# Patient Record
Sex: Male | Born: 1960 | Race: White | Hispanic: No | Marital: Single | Smoking: Former smoker
Health system: Southern US, Community
[De-identification: ages and names within clinical notes are randomized; demographics above are authoritative.]

## PROBLEM LIST (undated history)

## (undated) DIAGNOSIS — I10 Essential (primary) hypertension: Secondary | ICD-10-CM

## (undated) DIAGNOSIS — J9601 Acute respiratory failure with hypoxia: Secondary | ICD-10-CM

## (undated) DIAGNOSIS — I509 Heart failure, unspecified: Secondary | ICD-10-CM

## (undated) DIAGNOSIS — I272 Pulmonary hypertension, unspecified: Secondary | ICD-10-CM

## (undated) DIAGNOSIS — J449 Chronic obstructive pulmonary disease, unspecified: Secondary | ICD-10-CM

## (undated) DIAGNOSIS — J189 Pneumonia, unspecified organism: Secondary | ICD-10-CM

## (undated) DIAGNOSIS — R0902 Hypoxemia: Secondary | ICD-10-CM

## (undated) HISTORY — PX: ABDOMINAL SURGERY: SHX537

## (undated) HISTORY — DX: Chronic obstructive pulmonary disease, unspecified: J44.9

## (undated) HISTORY — DX: Essential (primary) hypertension: I10

---

## 2016-09-10 DIAGNOSIS — I2609 Other pulmonary embolism with acute cor pulmonale: Secondary | ICD-10-CM | POA: Insufficient documentation

## 2016-09-10 DIAGNOSIS — R7989 Other specified abnormal findings of blood chemistry: Secondary | ICD-10-CM | POA: Insufficient documentation

## 2016-09-10 DIAGNOSIS — F1010 Alcohol abuse, uncomplicated: Secondary | ICD-10-CM | POA: Insufficient documentation

## 2016-09-10 DIAGNOSIS — I21A1 Myocardial infarction type 2: Secondary | ICD-10-CM | POA: Insufficient documentation

## 2016-09-10 DIAGNOSIS — Z720 Tobacco use: Secondary | ICD-10-CM | POA: Insufficient documentation

## 2021-12-30 ENCOUNTER — Inpatient Hospital Stay: Admission: AD | Admit: 2021-12-30 | Payer: Self-pay | Admitting: Internal Medicine

## 2022-01-01 ENCOUNTER — Other Ambulatory Visit: Payer: Self-pay

## 2022-01-01 ENCOUNTER — Emergency Department (HOSPITAL_COMMUNITY): Payer: BLUE CROSS/BLUE SHIELD

## 2022-01-01 ENCOUNTER — Encounter (HOSPITAL_COMMUNITY): Payer: Self-pay | Admitting: *Deleted

## 2022-01-01 ENCOUNTER — Inpatient Hospital Stay (HOSPITAL_COMMUNITY)
Admission: EM | Admit: 2022-01-01 | Discharge: 2022-01-03 | DRG: 193 | Disposition: A | Discharge status: Home / Self Care | Payer: BLUE CROSS/BLUE SHIELD | Attending: Internal Medicine | Admitting: Internal Medicine

## 2022-01-01 DIAGNOSIS — I2729 Other secondary pulmonary hypertension: Secondary | ICD-10-CM | POA: Diagnosis present

## 2022-01-01 DIAGNOSIS — I110 Hypertensive heart disease with heart failure: Secondary | ICD-10-CM | POA: Diagnosis present

## 2022-01-01 DIAGNOSIS — I509 Heart failure, unspecified: Secondary | ICD-10-CM

## 2022-01-01 DIAGNOSIS — R911 Solitary pulmonary nodule: Secondary | ICD-10-CM | POA: Diagnosis present

## 2022-01-01 DIAGNOSIS — I10 Essential (primary) hypertension: Secondary | ICD-10-CM | POA: Diagnosis present

## 2022-01-01 DIAGNOSIS — Z8249 Family history of ischemic heart disease and other diseases of the circulatory system: Secondary | ICD-10-CM | POA: Diagnosis not present

## 2022-01-01 DIAGNOSIS — J9601 Acute respiratory failure with hypoxia: Secondary | ICD-10-CM | POA: Diagnosis present

## 2022-01-01 DIAGNOSIS — J181 Lobar pneumonia, unspecified organism: Secondary | ICD-10-CM | POA: Diagnosis present

## 2022-01-01 DIAGNOSIS — Z79899 Other long term (current) drug therapy: Secondary | ICD-10-CM

## 2022-01-01 DIAGNOSIS — Z87891 Personal history of nicotine dependence: Secondary | ICD-10-CM

## 2022-01-01 DIAGNOSIS — J189 Pneumonia, unspecified organism: Secondary | ICD-10-CM

## 2022-01-01 DIAGNOSIS — I5032 Chronic diastolic (congestive) heart failure: Secondary | ICD-10-CM

## 2022-01-01 DIAGNOSIS — J440 Chronic obstructive pulmonary disease with acute lower respiratory infection: Secondary | ICD-10-CM | POA: Diagnosis present

## 2022-01-01 DIAGNOSIS — Z20822 Contact with and (suspected) exposure to covid-19: Secondary | ICD-10-CM | POA: Diagnosis present

## 2022-01-01 DIAGNOSIS — I272 Pulmonary hypertension, unspecified: Secondary | ICD-10-CM | POA: Diagnosis present

## 2022-01-01 DIAGNOSIS — J9602 Acute respiratory failure with hypercapnia: Secondary | ICD-10-CM | POA: Diagnosis present

## 2022-01-01 DIAGNOSIS — I2781 Cor pulmonale (chronic): Secondary | ICD-10-CM | POA: Diagnosis present

## 2022-01-01 DIAGNOSIS — J9621 Acute and chronic respiratory failure with hypoxia: Principal | ICD-10-CM

## 2022-01-01 DIAGNOSIS — J441 Chronic obstructive pulmonary disease with (acute) exacerbation: Secondary | ICD-10-CM

## 2022-01-01 HISTORY — DX: Hypoxemia: R09.02

## 2022-01-01 HISTORY — DX: Heart failure, unspecified: I50.9

## 2022-01-01 HISTORY — DX: Pneumonia, unspecified organism: J18.9

## 2022-01-01 HISTORY — DX: Acute respiratory failure with hypoxia: J96.01

## 2022-01-01 HISTORY — DX: Pulmonary hypertension, unspecified: I27.20

## 2022-01-01 LAB — COMPREHENSIVE METABOLIC PANEL
ALT: 57 U/L — ABNORMAL HIGH (ref 0–44)
AST: 25 U/L (ref 15–41)
Albumin: 3.7 g/dL (ref 3.5–5.0)
Alkaline Phosphatase: 42 U/L (ref 38–126)
Anion gap: 10 (ref 5–15)
BUN: 20 mg/dL (ref 6–20)
CO2: 40 mmol/L — ABNORMAL HIGH (ref 22–32)
Calcium: 9.5 mg/dL (ref 8.9–10.3)
Chloride: 89 mmol/L — ABNORMAL LOW (ref 98–111)
Creatinine, Ser: 0.91 mg/dL (ref 0.61–1.24)
GFR, Estimated: >60 mL/min (ref >60)
Glucose, Bld: 115 mg/dL — ABNORMAL HIGH (ref 70–99)
Potassium: 3.9 mmol/L (ref 3.5–5.1)
Sodium: 139 mmol/L (ref 135–145)
Total Bilirubin: 0.5 mg/dL (ref 0.3–1.2)
Total Protein: 7.5 g/dL (ref 6.5–8.1)

## 2022-01-01 LAB — CBC WITH DIFFERENTIAL/PLATELET
Abs Immature Granulocytes: 0.82 10*3/uL — ABNORMAL HIGH (ref 0.00–0.07)
Basophils Absolute: 0.2 10*3/uL — ABNORMAL HIGH (ref 0.0–0.1)
Basophils Relative: 2 %
Eosinophils Absolute: 0 10*3/uL (ref 0.0–0.5)
Eosinophils Relative: 0 %
HCT: 48.2 % (ref 39.0–52.0)
Hemoglobin: 15.1 g/dL (ref 13.0–17.0)
Immature Granulocytes: 7 %
Lymphocytes Relative: 18 %
Lymphs Abs: 2.1 10*3/uL (ref 0.7–4.0)
MCH: 29.3 pg (ref 26.0–34.0)
MCHC: 31.3 g/dL (ref 30.0–36.0)
MCV: 93.4 fL (ref 80.0–100.0)
Monocytes Absolute: 0.9 10*3/uL (ref 0.1–1.0)
Monocytes Relative: 8 %
Neutro Abs: 7.9 10*3/uL — ABNORMAL HIGH (ref 1.7–7.7)
Neutrophils Relative %: 65 %
Platelets: 303 10*3/uL (ref 150–400)
RBC: 5.16 MIL/uL (ref 4.22–5.81)
RDW: 12.5 % (ref 11.5–15.5)
WBC: 12 10*3/uL — ABNORMAL HIGH (ref 4.0–10.5)
nRBC: 0 % (ref 0.0–0.2)

## 2022-01-01 LAB — BRAIN NATRIURETIC PEPTIDE: B Natriuretic Peptide: 56 pg/mL (ref 0.0–100.0)

## 2022-01-01 LAB — TROPONIN I (HIGH SENSITIVITY)
Troponin I (High Sensitivity): 6 ng/L (ref <18)
Troponin I (High Sensitivity): 6 ng/L (ref <18)

## 2022-01-01 LAB — RESP PANEL BY RT-PCR (FLU A&B, COVID) ARPGX2
Influenza A by PCR: NEGATIVE
Influenza B by PCR: NEGATIVE
SARS Coronavirus 2 by RT PCR: NEGATIVE

## 2022-01-01 MED ORDER — ACETAMINOPHEN 325 MG PO TABS: 650.0000 mg | ORAL_TABLET | Freq: Four times a day (QID) | ORAL | Status: DC | PRN

## 2022-01-01 MED ORDER — SODIUM CHLORIDE 0.9 % IV SOLN
2.0000 g | Freq: Three times a day (TID) | INTRAVENOUS | Status: DC
  Administered 2022-01-01 – 2022-01-03 (×6): 2 g via INTRAVENOUS
  Filled 2022-01-01 (×6): qty 12.5

## 2022-01-01 MED ORDER — VANCOMYCIN HCL 1500 MG/300ML IV SOLN
1500.0000 mg | INTRAVENOUS | Status: DC
  Administered 2022-01-01: 1500 mg via INTRAVENOUS
  Filled 2022-01-01: qty 300

## 2022-01-01 MED ORDER — ALBUTEROL SULFATE (2.5 MG/3ML) 0.083% IN NEBU: 2.5000 mg | INHALATION_SOLUTION | Freq: Four times a day (QID) | RESPIRATORY_TRACT | Status: DC | PRN

## 2022-01-01 MED ORDER — ACETAMINOPHEN 650 MG RE SUPP: 650.0000 mg | Freq: Four times a day (QID) | RECTAL | Status: DC | PRN

## 2022-01-01 MED ORDER — POLYETHYLENE GLYCOL 3350 17 G PO PACK: 17.0000 g | PACK | Freq: Every day | ORAL | Status: DC | PRN

## 2022-01-01 MED ORDER — ONDANSETRON HCL 4 MG PO TABS
4.0000 mg | ORAL_TABLET | Freq: Four times a day (QID) | ORAL | Status: DC | PRN
Start: 2022-01-01 — End: 2022-01-03

## 2022-01-01 MED ORDER — IOHEXOL 350 MG/ML SOLN
75.0000 mL | Freq: Once | INTRAVENOUS | Status: AC | PRN
  Administered 2022-01-01: 75 mL via INTRAVENOUS

## 2022-01-01 MED ORDER — SILDENAFIL CITRATE 20 MG PO TABS
20.0000 mg | ORAL_TABLET | Freq: Three times a day (TID) | ORAL | Status: DC
  Administered 2022-01-01 – 2022-01-03 (×4): 20 mg via ORAL
  Filled 2022-01-01 (×4): qty 1

## 2022-01-01 MED ORDER — SODIUM CHLORIDE 0.9 % IV SOLN
500.0000 mg | INTRAVENOUS | Status: DC
  Administered 2022-01-01: 500 mg via INTRAVENOUS
  Filled 2022-01-01: qty 5

## 2022-01-01 MED ORDER — SODIUM CHLORIDE 0.9 % IV SOLN
1.0000 g | Freq: Once | INTRAVENOUS | Status: AC
  Administered 2022-01-01: 1 g via INTRAVENOUS
  Filled 2022-01-01: qty 10

## 2022-01-01 MED ORDER — ENOXAPARIN SODIUM 40 MG/0.4ML IJ SOSY
40.0000 mg | PREFILLED_SYRINGE | INTRAMUSCULAR | Status: DC
  Administered 2022-01-01 – 2022-01-02 (×2): 40 mg via SUBCUTANEOUS
  Filled 2022-01-01 (×2): qty 0.4

## 2022-01-01 MED ORDER — ONDANSETRON HCL 4 MG/2ML IJ SOLN: 4.0000 mg | Freq: Four times a day (QID) | INTRAMUSCULAR | Status: DC | PRN

## 2022-01-01 NOTE — ED Triage Notes (Signed)
Pt states he was at [facility] for 4 days and does not feel like he received the treatment he needed to get better; pt O2 sats 85% while in triage; pt was recently diagnosed with pneumonia ?

## 2022-01-01 NOTE — Assessment & Plan Note (Addendum)
Presenting with difficulty breathing.  Minimal cough.  WBC 12.  Afebrile.  Rules out for sepsis. ?- CTA chest negative for PE, shows mild patchy dependent airspace consolidation bilateral lungs left greater than right. ?-Recent ED visit, boarded in ED for 4 days, chest x-ray with findings of moderate pulmonary edema or multifocal pneumonia--has had 5-6 days of prior abx ?-Urine strep and Legionella ?-PCT <0.10 ?-pt has 3 more days levofloxacin at home from prior hospitalization--I instructed pt to finish that up ?

## 2022-01-01 NOTE — Assessment & Plan Note (Addendum)
Stable. ?-Holding lisinopril temporarily for now with contrast exposure. ?

## 2022-01-01 NOTE — Assessment & Plan Note (Addendum)
Incidental finding today on CT a chest 01/01/22- solitary 5 mm noncalcified right upper lobe pulmonary nodule.  ?-Follow-up as outpatient ?

## 2022-01-01 NOTE — Assessment & Plan Note (Addendum)
O2 sats down to 85% on room air with tachypnea  ?-currently on 2 L sats greater than 95%.   ?Due to COPD exacerbation, and pneumonia. ?CTA chest--no PE--patchy lower lobe consolidations, left greater than right.  Right upper lobe nodule.  ?-Obtain echo EF 55-60%, no WMA, normal PASP ?-Patient requesting pulmonary consult>>appreciated>>can follow up outpt ?D/c home with 3L [place] ?

## 2022-01-01 NOTE — Assessment & Plan Note (Addendum)
Unspecified type.  Recent diagnosis at [facility] ER.  proBNP was checked and elevated at 760.  Chest x-ray showed pulmonary edema.  Was diuresed with IV Lasix 40 mg.  Echo was not obtained. ?Last echo per Care Everywhere 2019, EF 60 to 65%. ?-Will obtain updated echo  ?- At this time he appears euvolemic we will hold off on further Lasix for now ? ?

## 2022-01-01 NOTE — Assessment & Plan Note (Addendum)
Stable. ?-Resume sildenafil ?01/02/22 echocardiogram EF 55-60%, no WMA, normal PASP ?

## 2022-01-01 NOTE — ED Provider Notes (Signed)
? ?Emergency Department Provider Note ? ? ?I have reviewed the triage vital signs and the nursing notes. ? ? ?HISTORY ? ?Chief Complaint ?Shortness of Breath ? ? ?HPI ?Gerald Rios is a 61 y.o. male with PMH of pulmonary HTN presents to the ED for evaluation of worsening shortness of breath and fatigue.  Patient was evaluated at nearby emergency department on 4/16.  He was boarding in the emergency department awaiting transfer to a higher level of care when he ultimately decided that he was feeling better and went home.  He continues his home medications and was sent home with Lasix as well as Levaquin for presumed multifocal pneumonia.  He states since returning home his symptoms of gradually worsened.  Denies chest pain, fever, productive cough.  He states he did not sleep well at all last night and is feeling fatigued.  ? ? ?Past Medical History:  ?Diagnosis Date  ? Acute respiratory failure with hypoxia and hypercarbia (HCC)   ? Heart failure ([place])   ? Oxygen deficiency   ? Pneumonia   ? Pulmonary hypertension ([place])   ? ? ?Review of Systems ? ?Constitutional: No fever/chills. Positive fatigue.  ?Eyes: No visual changes. ?ENT: No sore throat. ?Cardiovascular: Denies chest pain. ?Respiratory: Positive shortness of breath. ?Gastrointestinal: No abdominal pain.  No nausea, no vomiting.  No diarrhea.  No constipation. ?Genitourinary: Negative for dysuria. ?Musculoskeletal: Negative for back pain. ?Skin: Negative for rash. ?Neurological: Negative for headaches. ? ?____________________________________________ ? ? ?PHYSICAL EXAM: ? ?VITAL SIGNS: ?ED Triage Vitals  ?Enc Vitals Group  ?   BP 01/01/22 1129 (!) 159/101  ?   Pulse Rate 01/01/22 1129 89  ?   Resp 01/01/22 1129 20  ?   Temp 01/01/22 1129 98.1 ?F (36.7 ?C)  ?   Temp Source 01/01/22 1129 Oral  ?   SpO2 01/01/22 1129 (!) 86 %  ?   Weight 01/01/22 1130 165 lb (74.8 kg)  ?   Height 01/01/22 1130 '5\' 5"'$  (1.651 m)  ? ?Constitutional: Alert and oriented. Well  appearing and in no acute distress. ?Eyes: Conjunctivae are normal.  ?Head: Atraumatic. ?Nose: No congestion/rhinnorhea. ?Mouth/Throat: Mucous membranes are moist.  ?Neck: No stridor.   ?Cardiovascular: Normal rate, regular rhythm. Good peripheral circulation. Grossly normal heart sounds.   ?Respiratory: Normal respiratory effort.  No retractions. Lungs CTAB. ?Gastrointestinal: Soft and nontender. No distention.  ?Musculoskeletal: No lower extremity tenderness nor edema. No gross deformities of extremities. ?Neurologic:  Normal speech and language. No gross focal neurologic deficits are appreciated.  ?Skin:  Skin is warm, dry and intact. No rash noted. ? ?____________________________________________ ?  ?LABS ?(all labs ordered are listed, but only abnormal results are displayed) ? ?Labs Reviewed  ?COMPREHENSIVE METABOLIC PANEL - Abnormal; Notable for the following components:  ?    Result Value  ? Chloride 89 (*)   ? CO2 40 (*)   ? Glucose, Bld 115 (*)   ? ALT 57 (*)   ? All other components within normal limits  ?CBC WITH DIFFERENTIAL/PLATELET - Abnormal; Notable for the following components:  ? WBC 12.0 (*)   ? Neutro Abs 7.9 (*)   ? Basophils Absolute 0.2 (*)   ? Abs Immature Granulocytes 0.82 (*)   ? All other components within normal limits  ?RESP PANEL BY RT-PCR (FLU A&B, COVID) [id number]  ?BRAIN NATRIURETIC PEPTIDE  ?TROPONIN I (HIGH SENSITIVITY)  ?TROPONIN I (HIGH SENSITIVITY)  ? ?____________________________________________ ? ?EKG ? ? EKG Interpretation ? ?Date/Time:  Thursday January 01 2022 12:36:10 EDT ?Ventricular Rate:  79 ?PR Interval:  143 ?QRS Duration: 140 ?QT Interval:  442 ?QTC Calculation: 504 ?R Axis:   89 ?Text Interpretation: Sinus rhythm Right bundle branch block Baseline wander in lead(s) V6 No prior tracing for comparison Confirmed by Nanda Quinton [id number]) on 01/01/2022 12:40:15 PM ?  ? ?  ? ? ?____________________________________________ ? ?RADIOLOGY ? ?CT Angio Chest PE W and/or Wo  Contrast ? ?Result Date: 01/01/2022 ?CLINICAL DATA:  Shortness of breath. Pulmonary embolism (PE) suspected, high prob EXAM: CT ANGIOGRAPHY CHEST WITH CONTRAST TECHNIQUE: Multidetector CT imaging of the chest was performed using the standard protocol during bolus administration of intravenous contrast. Multiplanar CT image reconstructions and MIPs were obtained to evaluate the vascular anatomy. RADIATION DOSE REDUCTION: This exam was performed according to the departmental dose-optimization program which includes automated exposure control, adjustment of the mA and/or kV according to patient size and/or use of iterative reconstruction technique. CONTRAST:  40m OMNIPAQUE IOHEXOL 350 MG/ML SOLN COMPARISON:  Same day chest x-ray FINDINGS: Cardiovascular: Satisfactory opacification of the pulmonary arteries to the segmental level. No evidence of pulmonary embolism. Thoracic aorta is nonaneurysmal. Minimal atherosclerotic calcification of the aorta and coronary arteries. Normal heart size. No pericardial effusion. Mediastinum/Nodes: No enlarged mediastinal, hilar, or axillary lymph nodes. Thyroid gland, trachea, and esophagus demonstrate no significant findings. Lungs/Pleura: Mild patchy dependent airspace consolidations within the posterior basal segments of the bilateral lower lobes, left greater than right. 5 mm noncalcified pulmonary nodule at the peripheral aspect of the right upper lobe (series 6, image 79). Minimal centrilobular emphysema with apical predominance. No pleural effusion or pneumothorax. Upper Abdomen: No acute abnormality. Musculoskeletal: No chest wall abnormality. No acute or significant osseous findings. Review of the MIP images confirms the above findings. IMPRESSION: 1. No evidence of pulmonary embolism. 2. Mild patchy dependent airspace consolidations within the bilateral lower lobes, left greater than right, suggestive of atelectasis and/or pneumonia. 3. Solitary 5 mm noncalcified right upper  lobe pulmonary nodule. No follow-up needed if patient is low-risk.This recommendation follows the consensus statement: Guidelines for Management of Incidental Pulmonary Nodules Detected on CT Images: From the Fleischner Society 2017; Radiology 2017; 284:228-243. 4. Aortic and coronary artery atherosclerosis. 5. Minimal centrilobular emphysema (ICD10-J43.9). Electronically Signed   By: NDavina PokeD.O.   On: 01/01/2022 13:55  ? ?DG Chest Portable 1 View ? ?Result Date: 01/01/2022 ?CLINICAL DATA:  Shortness of breath. EXAM: PORTABLE CHEST 1 VIEW COMPARISON:  12/31/2021 FINDINGS: The heart size and mediastinal contours are within normal limits. Mild scarring noted in lateral left lung base. The lungs are otherwise clear. The visualized skeletal structures are unremarkable. IMPRESSION: No active disease. Electronically Signed   By: JMarlaine HindM.D.   On: 01/01/2022 12:16   ? ?____________________________________________ ? ? ?PROCEDURES ? ?Procedure(s) performed:  ? ?.Critical Care ?Performed by: LMargette Fast MD ?Authorized by: LMargette Fast MD  ? ?Critical care provider statement:  ?  Critical care time (minutes):  35 ?  Critical care time was exclusive of:  Separately billable procedures and treating other patients and teaching time ?  Critical care was necessary to treat or prevent imminent or life-threatening deterioration of the following conditions:  Respiratory failure ?  Critical care was time spent personally by me on the following activities:  Development of treatment plan with patient or surrogate, discussions with consultants, evaluation of patient's response to treatment, examination of patient, ordering and review of laboratory studies, ordering and review of radiographic studies, ordering  and performing treatments and interventions, pulse oximetry, re-evaluation of patient's condition, review of old charts, obtaining history from patient or surrogate and blood draw for specimens ?  I assumed  direction of critical care for this patient from another provider in my specialty: no   ?  Care discussed with: admitting provider   ? ? ?____________________________________________ ? ? ?INITIAL IMPRESSION / ASSESSMENT AND

## 2022-01-01 NOTE — H&P (Signed)
?History and Physical  ? ? ?Gerald Rios TZG:[id number] DOB: 1961-07-19 DOA: 01/01/2022 ? ?PCP: Tilda Burrow, NP  ? ?Patient coming from: Home ? ?I have personally briefly reviewed patient's old medical records in [place] ? ?Chief Complaint: Difficulty breathing ? ?HPI: Gerald Rios is a 61 y.o. male with medical history significant for pulmonary hypertension, hypertension. ?Patient presented to the ED with complaints of difficulty breathing and fatigue.   ?Patient was at [facility] ER 4 days ago- 4/16, diagnosed with pneumonia, and the plan was to admit him to another facility he boarded in the ED until yesterday he decided to leave (per Care Everywhere transfer was initiated as it was thought patient needed specialized pulmonary care for his severe pulmonary hypertension, not available at that facility) ? He was treated with IV ceftriaxone and azithromycin and was discharged on Levaquin.  Was also diagnosed with acute CHF,  was given IV Lasix, and discharged on 20 mg of Lasix. ?Since getting home his breathing has worsened.  He denies significant cough.  He has pulmonary hypertension for which he takes sildenafil, he has not had any breathing problems related to his pulmonary hypertension prior to this. ?He denies chest pain.  Reports some mild lower extremity swelling prior to [facility] ER visit, but this has resolved. ? ?ED Course: Patient 20.1.  Heart rate 70s to 80s.  Respiratory rate 15-24.  Blood pressure systolic 759F to 638G.  O2 sats down to 85% on room air.  On 2 L sats greater than 96%. ?Chest x-ray without acute abnormality, CTA chest shows mild patchy dependent airspace consolidations within bilateral lower lobes left greater than right suggestive of atelectasis or pneumonia. ?IV ceftriaxone and azithromycin started. ? ?Review of Systems: As per HPI all other systems reviewed and negative. ? ?Past Medical History:  ?Diagnosis Date  ? Acute respiratory failure with hypoxia and hypercarbia  (HCC)   ? Heart failure ([place])   ? Oxygen deficiency   ? Pneumonia   ? Pulmonary hypertension ([place])   ? ? ?Past Surgical History:  ?Procedure Laterality Date  ? ABDOMINAL SURGERY    ? ? ? reports that he has quit smoking. His smoking use included cigarettes. He has quit using smokeless tobacco. He reports current alcohol use. He reports that he does not currently use drugs. ? ?No Known Allergies ? ?Family history of hypertension. ? ?Prior to Admission medications   ?Medication Sig Start Date End Date Taking? Authorizing Provider  ?lisinopril (ZESTRIL) 20 MG tablet Take 20 mg by mouth daily. 12/05/21  Yes [provider]  ?sildenafil (REVATIO) 20 MG tablet Take 20 mg by mouth 3 (three) times daily. 11/18/21  Yes [provider]  ? ? ?Physical Exam: ?Vitals:  ? 01/01/22 1430 01/01/22 1510 01/01/22 1515 01/01/22 1530  ?BP: (!) 158/102   (!) 163/101  ?Pulse: 76 75 72 72  ?Resp: (!) 25 (!) 22 (!) 24 17  ?Temp:      ?TempSrc:      ?SpO2: 97% 97% 95% 99%  ?Weight:      ?Height:      ? ? ?Constitutional: NAD, calm, comfortable ?Vitals:  ? 01/01/22 1430 01/01/22 1510 01/01/22 1515 01/01/22 1530  ?BP: (!) 158/102   (!) 163/101  ?Pulse: 76 75 72 72  ?Resp: (!) 25 (!) 22 (!) 24 17  ?Temp:      ?TempSrc:      ?SpO2: 97% 97% 95% 99%  ?Weight:      ?Height:      ? ?  Eyes: PERRL, lids and conjunctivae normal ?ENMT: Mucous membranes are moist. Posterior pharynx clear of any exudate or lesions.Normal dentition.  ?Neck: normal, supple, no masses, no thyromegaly ?Respiratory: Mild increased work of breathing, but clear to auscultation bilaterally, no wheezing, no crackles. No accessory muscle use.  ?Cardiovascular: Regular rate and rhythm, no murmurs / rubs / gallops. No extremity edema.  Lower extremities warm ?Abdomen: no tenderness, no masses palpated. No hepatosplenomegaly. Bowel sounds positive.  ?Musculoskeletal: no clubbing / cyanosis. No joint deformity upper and lower extremities.  ?Skin: no rashes, lesions,  ulcers. No induration ?Neurologic: No apparent cranial nerve abnormality, moving extremities spontaneously ?Psychiatric: Normal judgment and insight. Alert and oriented x 3. Normal mood.  ? ?Labs on Admission: I have personally reviewed following labs and imaging studies ? ?CBC: ?Recent Labs  ?Lab 01/01/22 ?1144  ?WBC 12.0*  ?NEUTROABS 7.9*  ?HGB 15.1  ?HCT 48.2  ?MCV 93.4  ?PLT 303  ? ?Basic Metabolic Panel: ?Recent Labs  ?Lab 01/01/22 ?1144  ?NA 139  ?K 3.9  ?CL 89*  ?CO2 40*  ?GLUCOSE 115*  ?BUN 20  ?CREATININE 0.91  ?CALCIUM 9.5  ? ?Liver Function Tests: ?Recent Labs  ?Lab 01/01/22 ?1144  ?AST 25  ?ALT 57*  ?ALKPHOS 42  ?BILITOT 0.5  ?PROT 7.5  ?ALBUMIN 3.7  ? ? ?Radiological Exams on Admission: ?CT Angio Chest PE W and/or Wo Contrast ? ?Result Date: 01/01/2022 ?CLINICAL DATA:  Shortness of breath. Pulmonary embolism (PE) suspected, high prob EXAM: CT ANGIOGRAPHY CHEST WITH CONTRAST TECHNIQUE: Multidetector CT imaging of the chest was performed using the standard protocol during bolus administration of intravenous contrast. Multiplanar CT image reconstructions and MIPs were obtained to evaluate the vascular anatomy. RADIATION DOSE REDUCTION: This exam was performed according to the departmental dose-optimization program which includes automated exposure control, adjustment of the mA and/or kV according to patient size and/or use of iterative reconstruction technique. CONTRAST:  13m OMNIPAQUE IOHEXOL 350 MG/ML SOLN COMPARISON:  Same day chest x-ray FINDINGS: Cardiovascular: Satisfactory opacification of the pulmonary arteries to the segmental level. No evidence of pulmonary embolism. Thoracic aorta is nonaneurysmal. Minimal atherosclerotic calcification of the aorta and coronary arteries. Normal heart size. No pericardial effusion. Mediastinum/Nodes: No enlarged mediastinal, hilar, or axillary lymph nodes. Thyroid gland, trachea, and esophagus demonstrate no significant findings. Lungs/Pleura: Mild patchy  dependent airspace consolidations within the posterior basal segments of the bilateral lower lobes, left greater than right. 5 mm noncalcified pulmonary nodule at the peripheral aspect of the right upper lobe (series 6, image 79). Minimal centrilobular emphysema with apical predominance. No pleural effusion or pneumothorax. Upper Abdomen: No acute abnormality. Musculoskeletal: No chest wall abnormality. No acute or significant osseous findings. Review of the MIP images confirms the above findings. IMPRESSION: 1. No evidence of pulmonary embolism. 2. Mild patchy dependent airspace consolidations within the bilateral lower lobes, left greater than right, suggestive of atelectasis and/or pneumonia. 3. Solitary 5 mm noncalcified right upper lobe pulmonary nodule. No follow-up needed if patient is low-risk.This recommendation follows the consensus statement: Guidelines for Management of Incidental Pulmonary Nodules Detected on CT Images: From the Fleischner Society 2017; Radiology 2017; 284:228-243. 4. Aortic and coronary artery atherosclerosis. 5. Minimal centrilobular emphysema (ICD10-J43.9). Electronically Signed   By: NDavina PokeD.O.   On: 01/01/2022 13:55  ? ?DG Chest Portable 1 View ? ?Result Date: 01/01/2022 ?CLINICAL DATA:  Shortness of breath. EXAM: PORTABLE CHEST 1 VIEW COMPARISON:  12/31/2021 FINDINGS: The heart size and mediastinal contours are within normal limits. Mild  scarring noted in lateral left lung base. The lungs are otherwise clear. The visualized skeletal structures are unremarkable. IMPRESSION: No active disease. Electronically Signed   By: Marlaine Hind M.D.   On: 01/01/2022 12:16   ? ?EKG: Independently reviewed.  Sinus rhythm rate 82.  QTc 495.  RBBB, LPFB.  No prior EKG to compare. ? ?Assessment/Plan ?Principal Problem: ?  Acute respiratory failure with hypoxia ([place]) ?Active Problems: ?  Pneumonia ?  Pulmonary HTN ([place]) ?  Essential hypertension ?  Pulmonary nodule ?  Congestive heart  failure (CHF) ([place]) ? ? ?Assessment and Plan: ?* Acute respiratory failure with hypoxia (HCC) ?O2 sats down to 85% on room air, currently on 2 L sats greater than 95%.  CT suggest pneumonia.  He has baseline pulmonary h

## 2022-01-01 NOTE — Progress Notes (Signed)
Pharmacy Antibiotic Note ? ?Gerald Rios is a 61 y.o. male admitted on 01/01/2022 with pneumonia.  Pharmacy has been consulted for Vancomycin and Cefepime dosing. ? ?Patient was at [facility] ER 4/16-4/19 where he received IV rocephin and azithromycin, left 4/19 with Rx for Levaquin. Now at [facility] ED with difficulty breathing. ? ?Plan: ?Cefepime 2gm IV q8h ?Vancomycin '1500mg'$  IV Q 24 hrs. Goal AUC 400-550. Expected AUC: 407. SCr used: 0.91 ?Will f/u renal function, micro data, and pt's clinical condition ?Vanc levels prn ? ? ?Height: '5\' 5"'$  (165.1 cm) ?Weight: 74.8 kg (165 lb) ?IBW/kg (Calculated) : 61.5 ? ?Temp (24hrs), Avg:98 ?F (36.7 ?C), Min:97.9 ?F (36.6 ?C), Max:98.1 ?F (36.7 ?C) ? ?Recent Labs  ?Lab 01/01/22 ?1144  ?WBC 12.0*  ?CREATININE 0.91  ?  ?Estimated Creatinine Clearance: 83.6 mL/min (by C-G formula based on SCr of 0.91 mg/dL).   ? ?No Known Allergies ? ?Antimicrobials this admission: ?4/20 Vanc >>  ?4/20 Cefepime >>  ?4/20 Rocephin x 1 ?4/20 Azith x 1 ? ?Microbiology results: ? Pending ? ?Thank you for allowing pharmacy to be a part of this patient?s care. ? ?Sherlon Handing, PharmD, BCPS ?Please see amion for complete clinical pharmacist phone list ?01/01/2022 5:18 PM ? ?

## 2022-01-02 ENCOUNTER — Inpatient Hospital Stay (HOSPITAL_COMMUNITY): Payer: BLUE CROSS/BLUE SHIELD

## 2022-01-02 DIAGNOSIS — I5032 Chronic diastolic (congestive) heart failure: Secondary | ICD-10-CM

## 2022-01-02 DIAGNOSIS — J9601 Acute respiratory failure with hypoxia: Secondary | ICD-10-CM | POA: Diagnosis not present

## 2022-01-02 DIAGNOSIS — J441 Chronic obstructive pulmonary disease with (acute) exacerbation: Secondary | ICD-10-CM | POA: Diagnosis not present

## 2022-01-02 DIAGNOSIS — I272 Pulmonary hypertension, unspecified: Secondary | ICD-10-CM | POA: Diagnosis not present

## 2022-01-02 DIAGNOSIS — J181 Lobar pneumonia, unspecified organism: Principal | ICD-10-CM

## 2022-01-02 DIAGNOSIS — I10 Essential (primary) hypertension: Secondary | ICD-10-CM | POA: Diagnosis not present

## 2022-01-02 LAB — ECHOCARDIOGRAM COMPLETE
AR max vel: 2.34 cm2
AV Area VTI: 2.4 cm2
AV Area mean vel: 2.7 cm2
AV Mean grad: 5 mmHg
AV Peak grad: 11.3 mmHg
Ao pk vel: 1.68 m/s
Area-P 1/2: 4.06 cm2
Calc EF: 55.1 %
Height: 65 in
MV VTI: 3.6 cm2
S' Lateral: 3 cm
Single Plane A2C EF: 48.7 %
Single Plane A4C EF: 61.4 %
Weight: 2670.21 oz

## 2022-01-02 LAB — CBC
HCT: 46.3 % (ref 39.0–52.0)
Hemoglobin: 14.1 g/dL (ref 13.0–17.0)
MCH: 28.8 pg (ref 26.0–34.0)
MCHC: 30.5 g/dL (ref 30.0–36.0)
MCV: 94.7 fL (ref 80.0–100.0)
Platelets: 294 10*3/uL (ref 150–400)
RBC: 4.89 MIL/uL (ref 4.22–5.81)
RDW: 12.5 % (ref 11.5–15.5)
WBC: 11.1 10*3/uL — ABNORMAL HIGH (ref 4.0–10.5)
nRBC: 0 % (ref 0.0–0.2)

## 2022-01-02 LAB — BASIC METABOLIC PANEL
Anion gap: 9 (ref 5–15)
BUN: 19 mg/dL (ref 6–20)
CO2: 40 mmol/L — ABNORMAL HIGH (ref 22–32)
Calcium: 9.1 mg/dL (ref 8.9–10.3)
Chloride: 93 mmol/L — ABNORMAL LOW (ref 98–111)
Creatinine, Ser: 0.93 mg/dL (ref 0.61–1.24)
GFR, Estimated: >60 mL/min (ref >60)
Glucose, Bld: 83 mg/dL (ref 70–99)
Potassium: 4.1 mmol/L (ref 3.5–5.1)
Sodium: 142 mmol/L (ref 135–145)

## 2022-01-02 LAB — HIV ANTIBODY (ROUTINE TESTING W REFLEX): HIV Screen 4th Generation wRfx: NONREACTIVE

## 2022-01-02 LAB — PROCALCITONIN: Procalcitonin: <0.1 ng/mL

## 2022-01-02 LAB — MRSA NEXT GEN BY PCR, NASAL: MRSA by PCR Next Gen: NOT DETECTED

## 2022-01-02 MED ORDER — IPRATROPIUM-ALBUTEROL 0.5-2.5 (3) MG/3ML IN SOLN
3.0000 mL | Freq: Four times a day (QID) | RESPIRATORY_TRACT | Status: DC
  Administered 2022-01-02 (×2): 3 mL via RESPIRATORY_TRACT
  Filled 2022-01-02 (×2): qty 3

## 2022-01-02 MED ORDER — BUDESONIDE 0.5 MG/2ML IN SUSP
0.5000 mg | Freq: Two times a day (BID) | RESPIRATORY_TRACT | Status: DC
Start: 2022-01-02 — End: 2022-01-03
  Administered 2022-01-02 – 2022-01-03 (×2): 0.5 mg via RESPIRATORY_TRACT
  Filled 2022-01-02 (×2): qty 2

## 2022-01-02 MED ORDER — IPRATROPIUM-ALBUTEROL 0.5-2.5 (3) MG/3ML IN SOLN
3.0000 mL | Freq: Three times a day (TID) | RESPIRATORY_TRACT | Status: DC
  Administered 2022-01-03: 3 mL via RESPIRATORY_TRACT
  Filled 2022-01-02: qty 3

## 2022-01-02 MED ORDER — METHYLPREDNISOLONE SODIUM SUCC 40 MG IJ SOLR
40.0000 mg | Freq: Two times a day (BID) | INTRAMUSCULAR | Status: DC
  Administered 2022-01-02 – 2022-01-03 (×2): 40 mg via INTRAVENOUS
  Filled 2022-01-02 (×2): qty 1

## 2022-01-02 MED ORDER — ARFORMOTEROL TARTRATE 15 MCG/2ML IN NEBU
15.0000 ug | INHALATION_SOLUTION | Freq: Two times a day (BID) | RESPIRATORY_TRACT | Status: DC
  Administered 2022-01-02 – 2022-01-03 (×2): 15 ug via RESPIRATORY_TRACT
  Filled 2022-01-02 (×2): qty 2

## 2022-01-02 NOTE — Progress Notes (Signed)
*  PRELIMINARY RESULTS* ?Echocardiogram ?2D Echocardiogram has been performed. ? ?Gerald Rios ?01/02/2022, 11:05 AM ?

## 2022-01-02 NOTE — Assessment & Plan Note (Addendum)
Recent diagnosis at [facility] ER.   ?proBNP was checked at [facility] at 760.  Chest x-ray showed pulmonary edema.  Was diuresed with IV Lasix 40 mg.  ?-- Currently appears euvolemic ?--BNP 56 ?-- Echo above ?

## 2022-01-02 NOTE — TOC Progression Note (Signed)
?  Transition of Care (TOC) Screening Note ? ? ?Patient Details  ?Name: Gerald Rios ?Date of Birth: March 31, 1961 ? ? ?Transition of Care (TOC) CM/SW Contact:    ?Boneta Lucks, RN ?Phone Number: ?01/02/2022, 10:23 AM ? ?DC tomorrow- weaning today, watch for home oxygen. ? ?Transition of Care Department [facility]) has reviewed patient and no TOC needs have been identified at this time. We will continue to monitor patient advancement through interdisciplinary progression rounds. If new patient transition needs arise, please place a TOC consult. ? ? ? ?Expected Discharge Plan: Home/Self Care ?Barriers to Discharge: Continued Medical Work up ? ?Expected Discharge Plan and Services ?Expected Discharge Plan: Home/Self Care ?  ?   ?

## 2022-01-02 NOTE — Consult Note (Signed)
? ?NAME:  Gerald Rios, MRN:  [id number], DOB:  [birth year], LOS: 1 ?ADMISSION DATE:  01/01/2022, CONSULTATION DATE:  01/02/22 ?REFERRING MD:  Tat , CHIEF COMPLAINT:  sob  ? ?History of Present Illness:  ?61 y.o. 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   ?Patient was at [facility] ER 4 days PTA= 4/16, diagnosed with pneumonia, and the plan was to admit him to another facility he boarded in the ED until 4/19 he decided to leave (per Care Everywhere transfer was initiated as it was thought patient needed specialized pulmonary care for his severe pulmonary hypertension, not available at that facility) ? He was treated with IV ceftriaxone and azithromycin and was discharged on Levaquin.  Was also diagnosed with acute CHF,  was given IV Lasix, and discharged on 20 mg of Lasix. ?Since getting home his breathing   worsened.  He denied significant cough.  He has pulmonary hypertension for which he takes sildenafil, he has not had any breathing problems related to his pulmonary hypertension prior to this. ?He denies chest pain.  Reports some mild lower extremity swelling prior to [facility] ER visit, but this has resolved. ?  ?ED Course: Patient 76.1.  Heart rate 70s to 80s.  Respiratory rate 15-24.  Blood pressure systolic 409W to 119J.  O2 sats down to 85% on room air. On 2 L sats greater than 96%. ?Chest x-ray without acute abnormality, CTA chest shows mild patchy dependent airspace consolidations within bilateral lower lobes left greater than right suggestive of atelectasis or pneumonia. ?IV ceftriaxone and azithromycin started. ?  ? ?Baseline:  denies doe slow and flat, some problems with hills and steps. Not using 02 or much saba at all, no leg swelling  ? ?MMRC1 =  says can walk nl pace, flat grade, can't hurry or go uphills or steps s sob  s 02  ?  ? ?Significant Hospital Events: ?Including  procedures, antibiotic start and stop dates in addition to other pertinent events   ? ?RVP  01/01/22 neg  ?MRSA pcr 01/02/22 neg ? ? ?Scheduled Meds: ? arformoterol  15 mcg Nebulization BID  ? budesonide (PULMICORT) nebulizer solution  0.5 mg Nebulization BID  ? enoxaparin (LOVENOX) injection  40 mg Subcutaneous Q24H  ? ipratropium-albuterol  3 mL Nebulization Q6H  ? methylPREDNISolone (SOLU-MEDROL) injection  40 mg Intravenous Q12H  ? sildenafil  20 mg Oral TID  ? ?Continuous Infusions: ? ceFEPime (MAXIPIME) IV 2 g (01/02/22 0934)  ? ?PRN Meds:.acetaminophen **OR** acetaminophen, albuterol, ondansetron **OR** ondansetron (ZOFRAN) IV, polyethylene glycol  ? ? ?Interim History / Subjective:  ?Feeling better on 02, still not much cough at all  ? ?Objective   ?Blood pressure (!) 132/94, pulse 83, temperature 98.2 ?F (36.8 ?C), temperature source Oral, resp. rate 18, height '5\' 5"'$  (1.651 m), weight 75.7 kg, SpO2 96 %. ?   ?FiO2 (%):  [24 %] 24 %  ? ?Intake/Output Summary (Last 24 hours) at 01/02/2022 1538 ?Last data filed at 01/02/2022 1350 ?Gross per 24 hour  ?Intake 1904.87 ml  ?Output 200 ml  ?Net 1704.87 ml  ? ?Filed Weights  ? 01/01/22 1130 01/01/22 1950  ?Weight: 74.8 kg 75.7 kg  ? ? ?Examination: ?Tmax:  98.6 ?General appearance:    middle aged wm nad  ?At Rest 02 sats  96% on 3lpm   ?No jvd ?Oropharynx clear,  mucosa nl ?Neck supple ?Lungs with  minimal insp/exp rhonchi bilaterally ?RRR no s3 or or sign murmur ?Abd obese with nl  excursion  ?Extr warm with no edema or clubbing noted ?Neuro  Sensorium intact,  no apparent motor deficits  ? ? ? ?I personally reviewed images and agree with radiology impression as follows:  ? Chest CTa  01/01/22 ?1. No evidence of pulmonary embolism. ?2. Mild patchy dependent airspace consolidations within the ?bilateral lower lobes, left greater than right, suggestive of ?atelectasis and/or pneumonia. ? ? ? ?  ? ?Assessment & Plan:  ?1)  ? AECOPD  r/o  viral or bacterial CAP  ?- unusual  absence of URI type symptoms prior to onset  ?>>> agree with empirical rx with steroids/ laba/lCS  longterm will probably just do laba/lama  with steroids if more convincing aecopd occurs  ?>>> check alpha one AT phenotype. ? ? 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 ?3) Acute on chronic resp failure both hypoxemic and hypercarbic ?>>> titrate to keep sats > 90%  ? ?4) HBP ?ACE inhibitors are problematic in  pts with airway complaints because  even experienced pulmonologists can't always distinguish ace effects from copd/asthma.  By themselves they don't actually cause a problem, much like oxygen can't by itself start a fire, but they certainly serve as a powerful catalyst or enhancer for any "fire"  or inflammatory process in the upper airway, be it caused by an ET  tube or more commonly reflux (especially in the obese or pts with known GERD or who are on biphoshonates).  ? ? In the era of ARB near equivalency until we have a better handle on the reversibility of the airway problem, it just makes sense to avoid ACEI  entirely in the short run and then decide later, having established a level of airway control using a reasonable limited regimen, whether to add back ace but even then being very careful to observe the pt for worsening airway control and number of meds used/ needed to control symptoms.   ? ? ?Will plan on f/u p discharge at pt request  - nothing else to offer for now  ?Best Practice (right click and "Reselect all SmartList Selections" daily)  ?  ?Per triad  ? ?Labs   ?CBC: ?Recent Labs  ?Lab 01/01/22 ?1144 01/02/22 ?0503  ?WBC 12.0* 11.1*  ?NEUTROABS 7.9*  --   ?HGB 15.1 14.1  ?HCT 48.2 46.3  ?MCV 93.4 94.7  ?PLT 303 294  ? ? ?Basic Metabolic Panel: ?Recent Labs  ?Lab 01/01/22 ?1144 01/02/22 ?0503  ?NA 139 142  ?K 3.9 4.1  ?CL 89* 93*  ?CO2 40* 40*  ?GLUCOSE 115* 83  ?BUN 20 19  ?CREATININE 0.91 0.93  ?CALCIUM 9.5 9.1  ? ?GFR: ?Estimated  Creatinine Clearance: 82.3 mL/min (by C-G formula based on SCr of 0.93 mg/dL). ?Recent Labs  ?Lab 01/01/22 ?1144 01/02/22 ?0503  ?PROCALCITON  --  <0.10  ?WBC 12.0* 11.1*  ? ? ?Liver Function Tests: ?Recent Labs  ?Lab 01/01/22 ?1144  ?AST 25  ?ALT 57*  ?ALKPHOS 42  ?BILITOT 0.5  ?PROT 7.5  ?ALBUMIN 3.7  ? ?No results for input(s): LIPASE, AMYLASE in the last 168 hours. ?No results for input(s): AMMONIA in the last 168 hours. ? ?ABG ?No results found for: PHART, PCO2ART, PO2ART, HCO3, TCO2, ACIDBASEDEF, O2SAT  ? ?Coagulation Profile: ?No results for input(s): INR, PROTIME  in the last 168 hours. ? ?Cardiac Enzymes: ?No results for input(s): CKTOTAL, CKMB, CKMBINDEX, TROPONINI in the last 168 hours. ? ?HbA1C: ?No results found for: HGBA1C ? ?CBG: ?No results for input(s): GLUCAP in the last 168 hours. ? ?  ? ?Past Medical History:  ?He,  has a past medical history of Acute respiratory failure with hypoxia and hypercarbia ([place]), Heart failure ([place]), Oxygen deficiency, Pneumonia, and Pulmonary hypertension ([place]).  ? ?Surgical History:  ? ?Past Surgical History:  ?Procedure Laterality Date  ? ABDOMINAL SURGERY    ?  ? ?Social History:  ? reports that he has quit smoking. His smoking use included cigarettes. He has quit using smokeless tobacco. He reports current alcohol use. He reports that he does not currently use drugs.  ? ?Family History:  ?His family history is not on file.  ? ?Allergies ?No Known Allergies  ? ?Home Medications  ?Prior to Admission medications   ?Medication Sig Start Date End Date Taking? Authorizing Provider  ?lisinopril (ZESTRIL) 20 MG tablet Take 20 mg by mouth daily. 12/05/21  Yes [provider]  ?sildenafil (REVATIO) 20 MG tablet Take 20 mg by mouth 3 (three) times daily. 11/18/21  Yes [provider]  ?  ?  ?Christinia Gully, MD ?Pulmonary and Critical Care Medicine ?[place] ?Cell [phone]  ? ?After 7:00 pm call Elink  [phone]  ? ?  ?

## 2022-01-02 NOTE — Hospital Course (Signed)
61 year old male with history of pulmonary hypertension, systemic hypertension, COPD, remote tobacco use presenting with shortness of breath.  Patient states that he began having shortness of breath on 12/26/2021.  He presented to [facility] emergency department where he was boarded in the ED waiting for a transfer until 12/31/2021 when he decided to leave the hospital.  During his stay in the ED at [facility], the patient was treated for pulmonary edema with IV furosemide, and started on ceftriaxone and azithromycin for pneumonia.  He was also given intravenous Solu-Medrol.  He was discharged home with levofloxacin and Lasix for 5 days.  He stated that his breathing did improve a little bit, but he became short of breath again on 01/01/2022 which allowed him to come to [facility].  He denies any fevers, chills, chest pain, headache, neck pain, hemoptysis, nausea, vomiting, diarrhea, abdominal pain.  He has some dyspnea on exertion.  He stated that at the time he presented to [facility] he did have peripheral edema, but he states that now his peripheral edema has resolved.  He has not had any worsening orthopnea or PND type symptoms. ?In the ED, the patient was afebrile hemodynamically stable with oxygen saturation 86% on room air.  He was placed on 2 L with saturation 94-96%.  WBC 12.0, hemoglobin 15.1, platelets 203,000.  Sodium 139, potassium 3.9, bicarbonate 40, serum creatinine 0.91.  BNP was 56.  Troponin 6>>6. ?

## 2022-01-02 NOTE — Progress Notes (Signed)
?  ?       ?PROGRESS NOTE ? ?Vernie Piet JQB:[id number] DOB: 1961/02/02 DOA: 01/01/2022 ?PCP: Tilda Burrow, NP ? ?Brief History:  ?61 year old male with history of pulmonary hypertension, systemic hypertension, COPD, remote tobacco use presenting with shortness of breath.  Patient states that he began having shortness of breath on 12/26/2021.  He presented to [facility] emergency department where he was boarded in the ED waiting for a transfer until 12/31/2021 when he decided to leave the hospital.  During his stay in the ED at [facility], the patient was treated for pulmonary edema with IV furosemide, and started on ceftriaxone and azithromycin for pneumonia.  He was also given intravenous Solu-Medrol.  He was discharged home with levofloxacin and Lasix for 5 days.  He stated that his breathing did improve a little bit, but he became short of breath again on 01/01/2022 which allowed him to come to [facility].  He denies any fevers, chills, chest pain, headache, neck pain, hemoptysis, nausea, vomiting, diarrhea, abdominal pain.  He has some dyspnea on exertion.  He stated that at the time he presented to [facility] he did have peripheral edema, but he states that now his peripheral edema has resolved.  He has not had any worsening orthopnea or PND type symptoms. ?In the ED, the patient was afebrile hemodynamically stable with oxygen saturation 86% on room air.  He was placed on 2 L with saturation 94-96%.  WBC 12.0, hemoglobin 15.1, platelets 203,000.  Sodium 139, potassium 3.9, bicarbonate 40, serum creatinine 0.91.  BNP was 56.  Troponin 6>>6.  ? ? ? ?Assessment and Plan: ?* Acute respiratory failure with hypoxia (HCC) ?O2 sats down to 85% on room air with tachypnea  ?-currently on 2 L sats greater than 95%.   ?Due to COPD exacerbation, and pneumonia. ?CTA chest--no PE--patchy lower lobe consolidations, left greater than right.  Right upper lobe nodule.  ?-Obtain echo ?-Patient requesting pulmonary consult ? ?Lobar pneumonia  ([place]) ?Presenting with difficulty breathing.  Minimal cough.  WBC 12.  Afebrile.  Rules out for sepsis. ?- CTA chest negative for PE, shows mild patchy dependent airspace consolidation bilateral lungs left greater than right. ?-Recent ED visit, boarded in ED for 4 days, chest x-ray with findings of moderate pulmonary edema or multifocal pneumonia--has had 5-6 days of prior abx ?-Urine strep and Legionella ?-PCT <0.10 ?-likely de-escalate to po abx in next 24 hours ? ?COPD with acute exacerbation ([place]) ?Start Pulmicort ?DuoNebs ? ? ?Chronic heart failure with preserved ejection fraction (HFpEF) ([place]) ?Recent diagnosis at [facility] ER.   ?proBNP was checked at [facility] at 760.  Chest x-ray showed pulmonary edema.  Was diuresed with IV Lasix 40 mg.  ?-- Currently appears euvolemic ?--BNP 56 ?-- Echocardiogram ? ?Pulmonary nodule ?Incidental finding today on CT a chest 01/01/22- solitary 5 mm noncalcified right upper lobe pulmonary nodule.  ?-Follow-up as outpatient ? ?Essential hypertension ?Stable. ?-Holding lisinopril temporarily for now with contrast exposure. ? ?Pulmonary HTN ([place]) ?Stable. ?-Resume sildenafil ?-Obtain echocardiogram ? ? ? ? ? ? ?Family Communication:  no Family at bedside ? ?Consultants:  pulm ? ?Code Status:  FULL ? ?DVT Prophylaxis:  [place] Lovenox ? ? ?Procedures: ?As Listed in Progress Note Above ? ?Antibiotics: ?Cefepime 4/20>> ?Vanc 4/20 ? ? ? ? ? ?Subjective: ? ?Patient has some dyspnea on exertion.  He states that his breathing is slowly improving.  He denies any fevers, chills, chest pain, headache, nausea, vomiting, diarrhea, abdominal pain.  There is no  dysuria or hematuria.  No hematochezia or melena. ?Objective: ?Vitals:  ? 01/01/22 1950 01/01/22 2203 01/02/22 0207 01/02/22 0554  ?BP:  (!) 156/94 (!) 147/82 (!) 143/80  ?Pulse:  69 80 90  ?Resp:  '18 20 19  '$ ?Temp:  97.8 ?F (36.6 ?C) 97.7 ?F (36.5 ?C) 97.9 ?F (36.6 ?C)  ?TempSrc:      ?SpO2:  96% 94% 95%  ?Weight: 75.7 kg     ?Height: '5\' 5"'$  (1.651 m)      ? ? ?Intake/Output Summary (Last 24 hours) at 01/02/2022 0829 ?Last data filed at 01/02/2022 [phone] ?Gross per 24 hour  ?Intake 1096.21 ml  ?Output 200 ml  ?Net 896.21 ml  ? ?Weight change:  ?Exam: ? ?General:  Pt is alert, follows commands appropriately, not in acute distress ?HEENT: No icterus, No thrush, No neck mass, [place]/AT ?Cardiovascular: RRR, S1/S2, no rubs, no gallops ?Respiratory: Diminished breath sounds bilateral.  Bibasilar rales. ?Abdomen: Soft/+BS, non tender, non distended, no guarding ?Extremities: No edema, No lymphangitis, No petechiae, No rashes, no synovitis ? ? ?Data Reviewed: ?I have personally reviewed following labs and imaging studies ?Basic Metabolic Panel: ?Recent Labs  ?Lab 01/01/22 ?1144 01/02/22 ?0503  ?NA 139 142  ?K 3.9 4.1  ?CL 89* 93*  ?CO2 40* 40*  ?GLUCOSE 115* 83  ?BUN 20 19  ?CREATININE 0.91 0.93  ?CALCIUM 9.5 9.1  ? ?Liver Function Tests: ?Recent Labs  ?Lab 01/01/22 ?1144  ?AST 25  ?ALT 57*  ?ALKPHOS 42  ?BILITOT 0.5  ?PROT 7.5  ?ALBUMIN 3.7  ? ?No results for input(s): LIPASE, AMYLASE in the last 168 hours. ?No results for input(s): AMMONIA in the last 168 hours. ?Coagulation Profile: ?No results for input(s): INR, PROTIME in the last 168 hours. ?CBC: ?Recent Labs  ?Lab 01/01/22 ?1144 01/02/22 ?0503  ?WBC 12.0* 11.1*  ?NEUTROABS 7.9*  --   ?HGB 15.1 14.1  ?HCT 48.2 46.3  ?MCV 93.4 94.7  ?PLT 303 294  ? ?Cardiac Enzymes: ?No results for input(s): CKTOTAL, CKMB, CKMBINDEX, TROPONINI in the last 168 hours. ?BNP: ?Invalid input(s): POCBNP ?CBG: ?No results for input(s): GLUCAP in the last 168 hours. ?HbA1C: ?No results for input(s): HGBA1C in the last 72 hours. ?Urine analysis: ?No results found for: COLORURINE, APPEARANCEUR, [place], [place], [place], [place], [place], KETONESUR, PROTEINUR, [place], NITRITE, LEUKOCYTESUR ?Sepsis Labs: ?'@LABRCNTIP'$ (procalcitonin:4,lacticidven:4) ?) ?Recent Results (from the past 240 hour(s))  ?Resp Panel by RT-PCR (Flu A&B, Covid)  Nasopharyngeal Swab     Status: None  ? Collection Time: 01/01/22 11:44 AM  ? Specimen: Nasopharyngeal Swab; Nasopharyngeal(NP) swabs in vial transport medium  ?Result Value Ref Range Status  ? SARS Coronavirus 2 by RT PCR NEGATIVE NEGATIVE Final  ?  Comment: (NOTE) ?SARS-CoV-2 target nucleic acids are NOT DETECTED. ? ?The SARS-CoV-2 RNA is generally detectable in upper respiratory ?specimens during the acute phase of infection. The lowest ?concentration of SARS-CoV-2 viral copies this assay can detect is ?138 copies/mL. A negative result does not preclude SARS-Cov-2 ?infection and should not be used as the sole basis for treatment or ?other patient management decisions. A negative result may occur with  ?improper specimen collection/handling, submission of specimen other ?than nasopharyngeal swab, presence of viral mutation(s) within the ?areas targeted by this assay, and inadequate number of viral ?copies(<138 copies/mL). A negative result must be combined with ?clinical observations, patient history, and epidemiological ?information. The expected result is Negative. ? ?Fact Sheet for Patients:  ?EntrepreneurPulse.com.au ? ?Fact Sheet for Healthcare Providers:  ?IncredibleEmployment.be ? ?This test is  no t yet approved or cleared by the [place] and  ?has been authorized for detection and/or diagnosis of SARS-CoV-2 by ?FDA under an Emergency Use Authorization (EUA). This EUA will remain  ?in effect (meaning this test can be used) for the duration of the ?COVID-19 declaration under Section 564(b)(1) of the Act, 21 ?U.S.C.section 360bbb-3(b)(1), unless the authorization is terminated  ?or revoked sooner.  ? ? ?  ? Influenza A by PCR NEGATIVE NEGATIVE Final  ? Influenza B by PCR NEGATIVE NEGATIVE Final  ?  Comment: (NOTE) ?The Xpert Xpress SARS-CoV-2/FLU/RSV plus assay is intended as an aid ?in the diagnosis of influenza from Nasopharyngeal swab specimens and ?should not be  used as a sole basis for treatment. Nasal washings and ?aspirates are unacceptable for Xpert Xpress SARS-CoV-2/FLU/RSV ?testing. ? ?Fact Sheet for Patients: ?EntrepreneurPulse.com.au ? ?Fact Sheet fo

## 2022-01-02 NOTE — Assessment & Plan Note (Addendum)
Start Pulmicort ?DuoNebs ?Treated with IV solumedrol 40 mg bid during hospitalization with clinical improvement ?D/c home with prednisone 50 mg x 4 more days ?D/c home with LABA and rescue albuterol ? ?

## 2022-01-03 LAB — BASIC METABOLIC PANEL
Anion gap: 5 (ref 5–15)
BUN: 18 mg/dL (ref 6–20)
CO2: 41 mmol/L — ABNORMAL HIGH (ref 22–32)
Calcium: 9.1 mg/dL (ref 8.9–10.3)
Chloride: 94 mmol/L — ABNORMAL LOW (ref 98–111)
Creatinine, Ser: 0.77 mg/dL (ref 0.61–1.24)
GFR, Estimated: >60 mL/min (ref >60)
Glucose, Bld: 105 mg/dL — ABNORMAL HIGH (ref 70–99)
Potassium: 4.7 mmol/L (ref 3.5–5.1)
Sodium: 140 mmol/L (ref 135–145)

## 2022-01-03 LAB — SEDIMENTATION RATE: Sed Rate: 14 mm/hr (ref 0–16)

## 2022-01-03 LAB — MAGNESIUM: Magnesium: 2.4 mg/dL (ref 1.7–2.4)

## 2022-01-03 LAB — TSH: TSH: 2.994 u[IU]/mL (ref 0.350–4.500)

## 2022-01-03 MED ORDER — MOMETASONE FURO-FORMOTEROL FUM 200-5 MCG/ACT IN AERO: 2.0000 | INHALATION_SPRAY | Freq: Two times a day (BID) | RESPIRATORY_TRACT | 1 refills | Status: DC

## 2022-01-03 MED ORDER — LOSARTAN POTASSIUM 50 MG PO TABS: 25.0000 mg | ORAL_TABLET | Freq: Every day | ORAL | Status: DC

## 2022-01-03 MED ORDER — PREDNISONE 20 MG PO TABS: 50.0000 mg | ORAL_TABLET | Freq: Every day | ORAL | Status: DC

## 2022-01-03 MED ORDER — LOSARTAN POTASSIUM 25 MG PO TABS: 25.0000 mg | ORAL_TABLET | Freq: Every day | ORAL | 1 refills | Status: DC

## 2022-01-03 MED ORDER — PREDNISONE 50 MG PO TABS: 50.0000 mg | ORAL_TABLET | Freq: Every day | ORAL | 0 refills | Status: DC

## 2022-01-03 MED ORDER — ALBUTEROL SULFATE HFA 108 (90 BASE) MCG/ACT IN AERS: 2.0000 | INHALATION_SPRAY | RESPIRATORY_TRACT | 1 refills | Status: DC | PRN

## 2022-01-03 MED ORDER — ALBUTEROL SULFATE (2.5 MG/3ML) 0.083% IN NEBU: 3.0000 mL | INHALATION_SOLUTION | RESPIRATORY_TRACT | Status: DC | PRN

## 2022-01-03 MED ORDER — MOMETASONE FURO-FORMOTEROL FUM 200-5 MCG/ACT IN AERO: 2.0000 | INHALATION_SPRAY | Freq: Two times a day (BID) | RESPIRATORY_TRACT | Status: DC

## 2022-01-03 MED ORDER — IPRATROPIUM-ALBUTEROL 0.5-2.5 (3) MG/3ML IN SOLN: 3.0000 mL | Freq: Two times a day (BID) | RESPIRATORY_TRACT | Status: DC

## 2022-01-03 NOTE — Progress Notes (Signed)
Patient discharged home today, transported home by family. Discharge paperwork went over with patient, patient verbalized understanding. Belongings sent home with patient. Patient discharged with home oxygen that [employer] delivered. ?

## 2022-01-03 NOTE — Progress Notes (Signed)
CSW spoke with Jasmine at [employer] to initiate home oxygen. [employer] will deliver oxygen to patient's room prior to discharge. ? ?Madilyn Fireman, MSW, LCSW ?Transitions of Care  Clinical Social Worker II ?[phone] ? ?

## 2022-01-03 NOTE — Progress Notes (Signed)
SATURATION QUALIFICATIONS: (This note is used to comply with regulatory documentation for home oxygen) ?  ?Patient Saturations on Room Air at Rest = 92 ?  ?Patient Saturations on Room Air while Ambulating = 84 ?  ?Patient Saturations on 3 Liters of oxygen while Ambulating = 96 ?  ?Please briefly explain why patient needs home oxygen: To maintain 02 sat at 90% or above during ambulation. ? ?Orson Eva, DO ? ?

## 2022-01-03 NOTE — Discharge Summary (Signed)
?Physician Discharge Summary ?  ?Patient: Gerald Rios MRN: [id number] DOB: [birth year]  ?Admit date:     01/01/2022  ?Discharge date: 01/03/22  ?Discharge Physician: Shanon Brow Cindel Daugherty  ? ?PCP: Tilda Burrow, NP  ? ?Recommendations at discharge:  ? Please follow up with primary care provider within 1-2 weeks ? Please repeat BMP and CBC in one week ? ? ? ? ? ?Hospital Course: ?61 year old male with history of pulmonary hypertension, systemic hypertension, COPD, remote tobacco use presenting with shortness of breath.  Patient states that he began having shortness of breath on 12/26/2021.  He presented to [facility] emergency department where he was boarded in the ED waiting for a transfer until 12/31/2021 when he decided to leave the hospital.  During his stay in the ED at [facility], the patient was treated for pulmonary edema with IV furosemide, and started on ceftriaxone and azithromycin for pneumonia.  He was also given intravenous Solu-Medrol.  He was discharged home with levofloxacin and Lasix for 5 days.  He stated that his breathing did improve a little bit, but he became short of breath again on 01/01/2022 which allowed him to come to [facility].  He denies any fevers, chills, chest pain, headache, neck pain, hemoptysis, nausea, vomiting, diarrhea, abdominal pain.  He has some dyspnea on exertion.  He stated that at the time he presented to [facility] he did have peripheral edema, but he states that now his peripheral edema has resolved.  He has not had any worsening orthopnea or PND type symptoms. ?In the ED, the patient was afebrile hemodynamically stable with oxygen saturation 86% on room air.  He was placed on 2 L with saturation 94-96%.  WBC 12.0, hemoglobin 15.1, platelets 203,000.  Sodium 139, potassium 3.9, bicarbonate 40, serum creatinine 0.91.  BNP was 56.  Troponin 6>>6. ? ?Assessment and Plan: ?* Acute respiratory failure with hypoxia (HCC) ?O2 sats down to 85% on room air with tachypnea  ?-currently on 2 L sats greater than  95%.   ?Due to COPD exacerbation, and pneumonia. ?CTA chest--no PE--patchy lower lobe consolidations, left greater than right.  Right upper lobe nodule.  ?-Obtain echo EF 55-60%, no WMA, normal PASP ?-Patient requesting pulmonary consult>>appreciated>>can follow up outpt ?D/c home with 3L [place] ? ?Lobar pneumonia ([place]) ?Presenting with difficulty breathing.  Minimal cough.  WBC 12.  Afebrile.  Rules out for sepsis. ?- CTA chest negative for PE, shows mild patchy dependent airspace consolidation bilateral lungs left greater than right. ?-Recent ED visit, boarded in ED for 4 days, chest x-ray with findings of moderate pulmonary edema or multifocal pneumonia--has had 5-6 days of prior abx ?-Urine strep and Legionella ?-PCT <0.10 ?-pt has 3 more days levofloxacin at home from prior hospitalization--I instructed pt to finish that up ? ?COPD with acute exacerbation ([place]) ?Start Pulmicort ?DuoNebs ?Treated with IV solumedrol 40 mg bid during hospitalization with clinical improvement ?D/c home with prednisone 50 mg x 4 more days ?D/c home with LABA and rescue albuterol ? ? ?Chronic heart failure with preserved ejection fraction (HFpEF) ([place]) ?Recent diagnosis at [facility] ER.   ?proBNP was checked at [facility] at 760.  Chest x-ray showed pulmonary edema.  Was diuresed with IV Lasix 40 mg.  ?-- Currently appears euvolemic ?--BNP 56 ?-- Echo above ? ?Pulmonary nodule ?Incidental finding today on CT a chest 01/01/22- solitary 5 mm noncalcified right upper lobe pulmonary nodule.  ?-Follow-up as outpatient ? ?Essential hypertension ?Stable. ?-Holding lisinopril temporarily for now with contrast exposure. ? ?Pulmonary  HTN ([place]) ?Stable. ?-Resume sildenafil ?01/02/22 echocardiogram EF 55-60%, no WMA, normal PASP ? ? ? ? ? ? ?Consultants: pulm ?Procedures performed: none  ?Disposition: Home ?Diet recommendation:  ?Cardiac diet ?DISCHARGE MEDICATION: ?Allergies as of 01/03/2022   ?No Known Allergies ?  ? ?  ?Medication List  ?  ? ?STOP taking these  medications   ? ?lisinopril 20 MG tablet ?Commonly known as: ZESTRIL ?  ? ?  ? ?TAKE these medications   ? ?albuterol 108 (90 Base) MCG/ACT inhaler ?Commonly known as: VENTOLIN HFA ?Inhale 2 puffs into the lungs every 4 (four) hours as needed for wheezing or shortness of breath. ?  ?losartan 25 MG tablet ?Commonly known as: COZAAR ?Take 1 tablet (25 mg total) by mouth daily. ?Start taking on: January 04, 2022 ?  ?mometasone-formoterol 200-5 MCG/ACT Aero ?Commonly known as: DULERA ?Inhale 2 puffs into the lungs 2 (two) times daily. ?  ?predniSONE 50 MG tablet ?Commonly known as: DELTASONE ?Take 1 tablet (50 mg total) by mouth daily with breakfast. ?Start taking on: January 04, 2022 ?  ?sildenafil 20 MG tablet ?Commonly known as: REVATIO ?Take 20 mg by mouth 3 (three) times daily. ?  ? ?  ? ?  ?  ? ? ?  ?Durable Medical Equipment  ?(From admission, onward)  ?  ? ? ?  ? ?  Start     Ordered  ? 01/03/22 1042  For home use only DME oxygen  Once       ?Question Answer Comment  ?Length of Need 6 Months   ?Mode or (Route) Nasal cannula   ?Liters per Minute 3   ?Frequency Continuous (stationary and portable oxygen unit needed)   ?Oxygen delivery system Gas   ?  ? 01/03/22 1041  ? ?  ?  ? ?  ? ? Follow-up Information   ? ? Tanda Rockers, MD Follow up in 2 week(s).   ?Specialty: Pulmonary Disease ?Contact information: ?[address] ?[address] ?[place] [address] ?[phone] ? ? ?  ?  ? ?  ?  ? ?  ? ?Discharge Exam: ?Filed Weights  ? 01/01/22 1130 01/01/22 1950  ?Weight: 74.8 kg 75.7 kg  ? ?HEENT:  [place]/AT, No thrush, no icterus ?CV:  RRR, no rub, no S3, no S4 ?Lung:  diminished BS.  Bibasilar rales.  ?Abd:  soft/+BS, NT ?Ext:  No edema, no lymphangitis, no synovitis, no rash ? ? ?Condition at discharge: stable ? ?The results of significant diagnostics from this hospitalization (including imaging, microbiology, ancillary and laboratory) are listed below for reference.  ? ?Imaging Studies: ?CT Angio Chest PE W and/or Wo  Contrast ? ?Result Date: 01/01/2022 ?CLINICAL DATA:  Shortness of breath. Pulmonary embolism (PE) suspected, high prob EXAM: CT ANGIOGRAPHY CHEST WITH CONTRAST TECHNIQUE: Multidetector CT imaging of the chest was performed using the standard protocol during bolus administration of intravenous contrast. Multiplanar CT image reconstructions and MIPs were obtained to evaluate the vascular anatomy. RADIATION DOSE REDUCTION: This exam was performed according to the departmental dose-optimization program which includes automated exposure control, adjustment of the mA and/or kV according to patient size and/or use of iterative reconstruction technique. CONTRAST:  29m OMNIPAQUE IOHEXOL 350 MG/ML SOLN COMPARISON:  Same day chest x-ray FINDINGS: Cardiovascular: Satisfactory opacification of the pulmonary arteries to the segmental level. No evidence of pulmonary embolism. Thoracic aorta is nonaneurysmal. Minimal atherosclerotic calcification of the aorta and coronary arteries. Normal heart size. No pericardial effusion. Mediastinum/Nodes: No enlarged mediastinal, hilar, or  axillary lymph nodes. Thyroid gland, trachea, and esophagus demonstrate no significant findings. Lungs/Pleura: Mild patchy dependent airspace consolidations within the posterior basal segments of the bilateral lower lobes, left greater than right. 5 mm noncalcified pulmonary nodule at the peripheral aspect of the right upper lobe (series 6, image 79). Minimal centrilobular emphysema with apical predominance. No pleural effusion or pneumothorax. Upper Abdomen: No acute abnormality. Musculoskeletal: No chest wall abnormality. No acute or significant osseous findings. Review of the MIP images confirms the above findings. IMPRESSION: 1. No evidence of pulmonary embolism. 2. Mild patchy dependent airspace consolidations within the bilateral lower lobes, left greater than right, suggestive of atelectasis and/or pneumonia. 3. Solitary 5 mm noncalcified right upper  lobe pulmonary nodule. No follow-up needed if patient is low-risk.This recommendation follows the consensus statement: Guidelines for Management of Incidental Pulmonary Nodules Detected on CT Images: From th

## 2022-01-05 LAB — ALPHA-1-ANTITRYPSIN PHENOTYP: A-1 Antitrypsin, Ser: 179 mg/dL (ref 101–187)

## 2022-04-14 ENCOUNTER — Ambulatory Visit: Payer: BLUE CROSS/BLUE SHIELD | Admitting: Internal Medicine

## 2022-04-14 ENCOUNTER — Encounter: Payer: Self-pay | Admitting: Internal Medicine

## 2022-04-14 DIAGNOSIS — J449 Chronic obstructive pulmonary disease, unspecified: Secondary | ICD-10-CM | POA: Diagnosis not present

## 2022-04-14 DIAGNOSIS — J9611 Chronic respiratory failure with hypoxia: Secondary | ICD-10-CM

## 2022-04-14 DIAGNOSIS — J9612 Chronic respiratory failure with hypercapnia: Secondary | ICD-10-CM

## 2022-04-14 DIAGNOSIS — R109 Unspecified abdominal pain: Secondary | ICD-10-CM

## 2022-04-14 DIAGNOSIS — J441 Chronic obstructive pulmonary disease with (acute) exacerbation: Secondary | ICD-10-CM

## 2022-04-14 MED ORDER — ANORO ELLIPTA 62.5-25 MCG/ACT IN AEPB: 1.0000 | INHALATION_SPRAY | Freq: Every day | RESPIRATORY_TRACT | 11 refills | Status: DC

## 2022-04-14 NOTE — Progress Notes (Unsigned)
Gerald Rios, male    DOB: [birth year]    MRN: [id number]   Brief patient profile:  33   yowm quit smoking 2017 self  referred to pulmonary clinic in [facility]  04/14/2022 for copd/ cor pulmonale      History of Present Illness  04/14/2022  Pulmonary/ 1st office eval/ Melvyn Novas / [facility]  Chief Complaint  Patient presents with   Consult    Dx with pulmonary hypertension since 2017. HFU seen by Dr. Melvyn Novas at [facility] 4/20-4/22. Has O2 at home but he uses as needed.   Dyspnea:  MMRC2 = can't walk a nl pace on a flat grade s sob but does fine slow   Cough: none  Sleep: flat bed/ 1 pillows  SABA use: none 02  2lpm hs / 2lpm at home and dips to 88% with exertion       Past Medical History:  Diagnosis Date   Acute respiratory failure with hypoxia and hypercarbia (HCC)    Heart failure (HCC)    Oxygen deficiency    Pneumonia    Pulmonary hypertension (HCC)     Outpatient Medications Prior to Visit  Medication Sig Dispense Refill   losartan (COZAAR) 25 MG tablet Take 1 tablet (25 mg total) by mouth daily. 30 tablet 1   sildenafil (REVATIO) 20 MG tablet Take 20 mg by mouth 3 (three) times daily.     albuterol (VENTOLIN HFA) 108 (90 Base) MCG/ACT inhaler Inhale 2 puffs into the lungs every 4 (four) hours as needed for wheezing or shortness of breath. 6.7 g 1   mometasone-formoterol (DULERA) 200-5 MCG/ACT AERO Inhale 2 puffs into the lungs 2 (two) times daily. 1 each 1   predniSONE (DELTASONE) 50 MG tablet Take 1 tablet (50 mg total) by mouth daily with breakfast. 4 tablet 0   No facility-administered medications prior to visit.     Objective:     BP (!) 144/86 (BP Location: Left Arm, Patient Position: Sitting)   Pulse (!) 102   Temp 98 F (36.7 C) (Temporal)   Ht '5\' 5"'$  (1.651 m)   Wt 168 lb 12.8 oz (76.6 kg)   SpO2 94% Comment: ra  BMI 28.09 kg/m   SpO2: 94 % (ra)  Amb wm nad  HEENT :  Oropharynx  ***  Nasal turbinates ***   NECK :  without JVD/Nodes/TM/ nl carotid  upstrokes bilaterally   LUNGS: no acc muscle use,  Mod barrel  contour chest wall with bilateral  Distant bs s audible wheeze and  without cough on insp or exp maneuvers and mod  Hyperresonant  to  percussion bilaterally     CV:  RRR  no s3 or murmur or increase in P2, and no edema   ABD:  soft and nontender with pos mid insp Hoover's  in the supine position. No bruits or organomegaly appreciated, bowel sounds nl  MS:   Ext warm without deformities or   obvious joint restrictions , calf tenderness, cyanosis or clubbing  SKIN: warm and dry without lesions    NEURO:  alert, approp, nl sensorium with  no motor or cerebellar deficits apparent.          I personally reviewed images and agree with radiology impression as follows:   Chest CTa  01/01/22 1. No evidence of pulmonary embolism. 2. Mild patchy dependent airspace consolidations within the bilateral lower lobes, left greater than right, suggestive of atelectasis and/or pneumonia. 3. Solitary 5 mm noncalcified right upper lobe pulmonary  nodule. No follow-up needed if patient is low-risk.  4. Aortic and coronary artery atherosclerosis. 5. Minimal centrilobular emphysema (ICD10-J43.9).      Assessment   No problem-specific Assessment & Plan notes found for this encounter.     Christinia Gully, MD 04/14/2022

## 2022-04-14 NOTE — Patient Instructions (Addendum)
Anoro each am :  one click and then take two good vigorous drags and hold 5 secs at least, then rinse mouth   Make sure you check your oxygen saturation  AT  your highest level of activity (not after you stop)   to be sure it stays over 90% and adjust  02 flow upward to maintain this level if needed but remember to turn it back to previous settings when you stop (to conserve your supply).   Classic subdiaphragmatic pain pattern suggests ibs:  Stereotypical, migratory with a very limited distribution of pain locations, daytime, not usually exacerbated by exercise  or coughing, worse in sitting position, frequently associated with generalized abd bloating, not as likely to be present supine due to the dome effect of the diaphragm which  is  canceled in that position. Frequently these patients have had multiple negative GI workups and CT scans.  Treatment consists of avoiding foods that cause gas (especially boiled eggs, mexcican food but especially  beans and undercooked vegetables like  spinach and some salads)  and citrucel 1 heaping tsp twice daily with a large glass of water.  Pain should improve w/in 2 weeks and if not then consider further GI work up.      We will order an overnight pulse oximetry and call you the results    Please the hard copy of your pfts and bring it in next time   Please schedule a follow up visit in 3 months but call sooner if needed

## 2022-04-15 ENCOUNTER — Encounter: Payer: Self-pay | Admitting: Internal Medicine

## 2022-04-15 DIAGNOSIS — R109 Unspecified abdominal pain: Secondary | ICD-10-CM | POA: Insufficient documentation

## 2022-04-15 DIAGNOSIS — J449 Chronic obstructive pulmonary disease, unspecified: Secondary | ICD-10-CM | POA: Insufficient documentation

## 2022-04-15 DIAGNOSIS — J9611 Chronic respiratory failure with hypoxia: Secondary | ICD-10-CM | POA: Insufficient documentation

## 2022-04-15 NOTE — Assessment & Plan Note (Signed)
rx as IBS 04/15/2022 >>>   Classic subdiaphragmatic pain pattern suggests ibs:  Stereotypical, migratory with a very limited distribution of pain locations, daytime, not usually exacerbated by exercise  or coughing, worse in sitting position, frequently associated with generalized abd bloating, not as likely to be present supine due to the dome effect of the diaphragm which  is  canceled in that position. Frequently these patients have had multiple negative GI workups and CT scans.  Treatment consists of avoiding foods that cause gas (especially boiled eggs, mexcican food but especially  beans and undercooked vegetables like  spinach and some salads)  and citrucel 1 heaping tsp twice daily with a large glass of water.  Pain should improve w/in 2 weeks and if not then consider further GI work up.

## 2022-04-15 NOTE — Assessment & Plan Note (Signed)
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

## 2022-04-15 NOTE — Assessment & Plan Note (Signed)
HC03  01/03/22   =  41  - 04/14/2022   Walked on RA  x  3  lap(s) =  approx 450  ft  @ mod pace, stopped due to end of study  with lowest 02 sats 89%  And sob on last lap  - ONO on RA 04/14/2022 >>>  Advised goal of sats is lower 90s since he is hypercarbic, does not need it at rest when at baseline  Each maintenance medication was reviewed in detail including emphasizing most importantly the difference between maintenance and prns and under what circumstances the prns are to be triggered using an action plan format where appropriate.  Total time for H and P, chart review, counseling, reviewing dpi/02 device(s) , directly observing portions of ambulatory 02 saturation study/ and generating customized AVS unique to this office visit / same day charting > 40 min for multiple  refractory respiratory  Symptoms  / 1st office visit post hosp

## 2022-04-21 ENCOUNTER — Encounter: Payer: Self-pay | Admitting: Internal Medicine

## 2022-06-08 ENCOUNTER — Other Ambulatory Visit: Payer: Self-pay | Admitting: Family Medicine

## 2022-06-09 ENCOUNTER — Other Ambulatory Visit: Payer: Self-pay

## 2022-06-09 ENCOUNTER — Encounter: Payer: Self-pay | Admitting: *Deleted

## 2022-06-09 ENCOUNTER — Emergency Department (HOSPITAL_COMMUNITY): Payer: BLUE CROSS/BLUE SHIELD

## 2022-06-09 ENCOUNTER — Encounter (HOSPITAL_COMMUNITY): Payer: Self-pay

## 2022-06-09 ENCOUNTER — Emergency Department (HOSPITAL_COMMUNITY)
Admission: EM | Admit: 2022-06-09 | Discharge: 2022-06-09 | Disposition: A | Discharge status: Home / Self Care | Payer: BLUE CROSS/BLUE SHIELD | Attending: Emergency Medicine | Admitting: Emergency Medicine

## 2022-06-09 DIAGNOSIS — Z79899 Other long term (current) drug therapy: Secondary | ICD-10-CM | POA: Diagnosis not present

## 2022-06-09 DIAGNOSIS — I509 Heart failure, unspecified: Secondary | ICD-10-CM | POA: Diagnosis not present

## 2022-06-09 DIAGNOSIS — Z87891 Personal history of nicotine dependence: Secondary | ICD-10-CM | POA: Diagnosis not present

## 2022-06-09 DIAGNOSIS — R1011 Right upper quadrant pain: Secondary | ICD-10-CM | POA: Diagnosis not present

## 2022-06-09 DIAGNOSIS — I110 Hypertensive heart disease with heart failure: Secondary | ICD-10-CM | POA: Diagnosis not present

## 2022-06-09 DIAGNOSIS — J449 Chronic obstructive pulmonary disease, unspecified: Secondary | ICD-10-CM | POA: Diagnosis not present

## 2022-06-09 LAB — COMPREHENSIVE METABOLIC PANEL
ALT: 16 U/L (ref 0–44)
AST: 19 U/L (ref 15–41)
Albumin: 4.2 g/dL (ref 3.5–5.0)
Alkaline Phosphatase: 28 U/L — ABNORMAL LOW (ref 38–126)
Anion gap: 8 (ref 5–15)
BUN: 14 mg/dL (ref 6–20)
CO2: 32 mmol/L (ref 22–32)
Calcium: 9.3 mg/dL (ref 8.9–10.3)
Chloride: 100 mmol/L (ref 98–111)
Creatinine, Ser: 0.83 mg/dL (ref 0.61–1.24)
GFR, Estimated: >60 mL/min (ref >60)
Glucose, Bld: 98 mg/dL (ref 70–99)
Potassium: 4.5 mmol/L (ref 3.5–5.1)
Sodium: 140 mmol/L (ref 135–145)
Total Bilirubin: 1 mg/dL (ref 0.3–1.2)
Total Protein: 6.8 g/dL (ref 6.5–8.1)

## 2022-06-09 LAB — URINALYSIS, ROUTINE W REFLEX MICROSCOPIC
Bilirubin Urine: NEGATIVE
Glucose, UA: NEGATIVE mg/dL
Hgb urine dipstick: NEGATIVE
Ketones, ur: NEGATIVE mg/dL
Leukocytes,Ua: NEGATIVE
Nitrite: NEGATIVE
Protein, ur: NEGATIVE mg/dL
Specific Gravity, Urine: 1.006 (ref 1.005–1.030)
pH: 8 (ref 5.0–8.0)

## 2022-06-09 LAB — CBC
HCT: 40.3 % (ref 39.0–52.0)
Hemoglobin: 13.4 g/dL (ref 13.0–17.0)
MCH: 30.5 pg (ref 26.0–34.0)
MCHC: 33.3 g/dL (ref 30.0–36.0)
MCV: 91.6 fL (ref 80.0–100.0)
Platelets: 199 10*3/uL (ref 150–400)
RBC: 4.4 MIL/uL (ref 4.22–5.81)
RDW: 13 % (ref 11.5–15.5)
WBC: 5.6 10*3/uL (ref 4.0–10.5)
nRBC: 0 % (ref 0.0–0.2)

## 2022-06-09 LAB — LIPASE, BLOOD: Lipase: 32 U/L (ref 11–51)

## 2022-06-09 MED ORDER — IBUPROFEN 600 MG PO TABS: 600.0000 mg | ORAL_TABLET | Freq: Four times a day (QID) | ORAL | 0 refills | Status: DC | PRN

## 2022-06-09 MED ORDER — DICYCLOMINE HCL 20 MG PO TABS: 20.0000 mg | ORAL_TABLET | Freq: Two times a day (BID) | ORAL | 0 refills | Status: DC

## 2022-06-09 MED ORDER — IBUPROFEN 400 MG PO TABS
600.0000 mg | ORAL_TABLET | Freq: Once | ORAL | Status: AC
Start: 2022-06-09 — End: 2022-06-09
  Administered 2022-06-09: 600 mg via ORAL
  Filled 2022-06-09: qty 2

## 2022-06-09 NOTE — Discharge Instructions (Addendum)
Note that your work-up today was overall reassuring.  There were no abnormalities in the lab tests ordered.  The ultrasound of the area where you are tender was negative.  I suspect the etiology of your symptoms being muscular in nature.  You can take ibuprofen/Tylenol as needed for pain.  Close follow-up with your PCP recommended in 2 to 3 days for reevaluation.  Please do not hesitate to return to the emergency department the worrisome signs and symptoms we discussed become apparent.

## 2022-06-09 NOTE — ED Triage Notes (Signed)
Pt presents to ED with complaints of right sided abdominal pain feels dull x couple months. Denies N/V/D

## 2022-06-09 NOTE — ED Provider Notes (Addendum)
[facility] EMERGENCY DEPARTMENT Provider Note   CSN: [id number] Arrival date & time: 06/09/22  2353     History  Chief Complaint  Patient presents with   Abdominal Pain    Gerald Rios is a 61 y.o. male.   Abdominal Pain   61 year old male presents emergency department with complaints of abdominal pain.  Patient states that symptoms have been present over the past 2 to 3 months and have been intermittent in nature.  Denies any associated symptoms with consumption of food.  She states that he notes some pain with movement.  Per patient, he was seen by his PCP for similar symptoms was told to come the emergency department for further evaluation due to lack of insurance coverage of imaging outpatient.  Per prior pulmonologist note, patient has been seen multiple times for same abdominal complaint with negative CT and ultrasounds of the abdomen.  Pulmonologist recommended treatment for IBS.  Patient was question regarding psych treatment and said that he is noted significant improvement with said treatment.  Denies fever, chills, night sweats, chest pain, shortness of breath, nausea, vomiting, urinary symptoms, change in bowel habits.  Patient has brought up symptoms to his pulmonologist who has been treating him with [place] outpatient for concerns of IBS.  Patient states that he has improved significantly with administration of Citrucel but presents emergency department for continued concern.  Past medical history significant for CHF, pulmonary hypertension, COPD  Home Medications Prior to Admission medications   Medication Sig Start Date End Date Taking? Authorizing Provider  dicyclomine (BENTYL) 20 MG tablet Take 1 tablet (20 mg total) by mouth 2 (two) times daily. 06/09/22  Yes Dion Saucier A, PA  ibuprofen (ADVIL) 600 MG tablet Take 1 tablet (600 mg total) by mouth every 6 (six) hours as needed. 06/09/22  Yes Dion Saucier A, PA  losartan (COZAAR) 25 MG tablet Take 1 tablet (25 mg  total) by mouth daily. 01/04/22   Orson Eva, MD  sildenafil (REVATIO) 20 MG tablet Take 20 mg by mouth 3 (three) times daily. 11/18/21   [provider]  umeclidinium-vilanterol (ANORO ELLIPTA) 62.5-25 MCG/ACT AEPB Inhale 1 puff into the lungs daily. 04/14/22   Tanda Rockers, MD      Allergies    Patient has no known allergies.    Review of Systems   Review of Systems  Gastrointestinal:  Positive for abdominal pain.  All other systems reviewed and are negative.   Physical Exam Updated Vital Signs BP (!) 148/92   Pulse 61   Temp 98.2 F (36.8 C) (Oral)   Resp 15   Ht '5\' 5"'$  (1.651 m)   Wt 75.3 kg   SpO2 99%   BMI 27.62 kg/m  Physical Exam Vitals and nursing note reviewed.  Constitutional:      General: He is not in acute distress.    Appearance: He is well-developed.  HENT:     Head: Normocephalic and atraumatic.  Eyes:     Conjunctiva/sclera: Conjunctivae normal.  Cardiovascular:     Rate and Rhythm: Normal rate and regular rhythm.     Pulses: Normal pulses.     Heart sounds: No murmur heard. Pulmonary:     Effort: Pulmonary effort is normal. No respiratory distress.     Breath sounds: Normal breath sounds.  Abdominal:     Palpations: Abdomen is soft.     Tenderness: There is abdominal tenderness in the right upper quadrant. There is no right CVA tenderness or left CVA  tenderness.     Comments: Mild right upper quadrant tenderness on exam.  No overlying skin abnormalities noted.  Musculoskeletal:        General: No swelling.     Cervical back: Neck supple.     Comments: No midline tenderness of cervical, thoracic, lumbar spine with no obvious step-off or deformity noted.  Skin:    General: Skin is warm and dry.     Capillary Refill: Capillary refill takes less than 2 seconds.  Neurological:     Mental Status: He is alert.  Psychiatric:        Mood and Affect: Mood normal.     ED Results / Procedures / Treatments   Labs (all labs ordered are listed,  but only abnormal results are displayed) Labs Reviewed  COMPREHENSIVE METABOLIC PANEL - Abnormal; Notable for the following components:      Result Value   Alkaline Phosphatase 28 (*)    All other components within normal limits  URINALYSIS, ROUTINE W REFLEX MICROSCOPIC - Abnormal; Notable for the following components:   Color, Urine STRAW (*)    All other components within normal limits  LIPASE, BLOOD  CBC    EKG None  Radiology US Abdomen Limited RUQ (LIVER/GB)  Result Date: 06/09/2022 CLINICAL DATA:  Right upper quadrant abdominal pain for 3 months. EXAM: ULTRASOUND ABDOMEN LIMITED RIGHT UPPER QUADRANT COMPARISON:  None Available. FINDINGS: Gallbladder: No gallstones or wall thickening visualized. No sonographic Murphy sign noted by sonographer. Common bile duct: Diameter: 4 mm which is within normal limits. Liver: No focal lesion identified. Within normal limits in parenchymal echogenicity. Portal vein is patent on color Doppler imaging with normal direction of blood flow towards the liver. Other: None. IMPRESSION: No definite abnormality seen in the right upper quadrant of the abdomen. Electronically Signed   By: Marijo Conception M.D.   On: 06/09/2022 11:28    Procedures Procedures    Medications Ordered in ED Medications  ibuprofen (ADVIL) tablet 600 mg (600 mg Oral Given 06/09/22 1309)    ED Course/ Medical Decision Making/ A&P                           Medical Decision Making Amount and/or Complexity of Data Reviewed Labs: ordered. Radiology: ordered.  Risk Prescription drug management.   This patient presents to the ED for concern of abdominal pain, this involves an extensive number of treatment options, and is a complaint that carries with it a high risk of complications and morbidity.  The differential diagnosis includes musculoskeletal, hepatitis, cholecystitis, gallbladder pathology, pancreatitis, small bowel obstruction, ACS, AAA, aortic dissection   Co  morbidities that complicate the patient evaluation  See HPI   Additional history obtained:  Additional history obtained from EMR  Lab Tests:  I Ordered, and personally interpreted labs.  The pertinent results include: No leukocytosis noted.  No evidence of anemia.  Platelets within normal range.  No electrolyte abnormality.  Renal function within normal limits.  No transaminitis noted.  UA without any acute abnormalities   Imaging Studies ordered:  I ordered imaging studies including right upper quadrant ultrasound I independently visualized and interpreted imaging which showed negative for any acute abnormalities I agree with the radiologist interpretation   Cardiac Monitoring: / EKG:  The patient was maintained on a cardiac monitor.  I personally viewed and interpreted the cardiac monitored which showed an underlying rhythm of: Sinus rhythm   Consultations Obtained:  N/a  Problem List / ED Course / Critical interventions / Medication management  Right upper quadrant abdominal pain I ordered medication including ibuprofen for pain   Reevaluation of the patient after these medicines showed that the patient improved I have reviewed the patients home medicines and have made adjustments as needed   Social Determinants of Health:  Previous cigarette use.  Denies illicit drug use.   Test / Admission - Considered:  Subacute right upper quadrant abdominal pain Vitals signs significant for hypertension with a blood pressure 144/79.  Recommend close follow-up with PCP regarding elevation of blood pressure.. Otherwise within normal range and stable throughout visit. Laboratory/imaging studies significant for: See above Unsure of exact etiology of patient's pain.  Right upper quadrant ultrasound negative for any acute abnormalities.  Patient has ongoing symptoms for the past 3 months that seem to be more positionally related than anything.  Pain is mildly reproducible on exam  in the right upper quadrant.  There is no overlying skin abnormalities noted.  Could be muscular pathology given the area of tenderness as well as reproducibility with abdominal flexion and horizontal twisting of the abdomen.  Further imaging deemed unnecessary at this time due to reassuring physical exam as well as chronicity of symptoms.  Doubt cardiac pathology.  Doubt dissection.  Doubt AAA.  Doubt liver or gallbladder or pancreatic pathology.  Per pulmonology, patient has been seen multiple times for similar complaint with overall negative work-ups with imaging of his abdomen via CT scans and ultrasounds.  Pulmonologist started Citrucel with patient of which he stated some improvement of symptoms.  Patient interested in Bentyl as outpatient medication to be tried for abdominal discomfort.  Further symptomatic therapy recommended with Tylenol/ibuprofen as needed for pain.  Close follow-up with PCP recommended in 2 to 3 days for reevaluation of symptoms..  Treatment plan discussed along with patient he acknowledged understanding was agreeable to said plan. Worrisome signs and symptoms were discussed with the patient, and the patient acknowledged understanding to return to the ED if noticed. Patient was stable upon discharge.         Final Clinical Impression(s) / ED Diagnoses Final diagnoses:  RUQ abdominal pain    Rx / DC Orders ED Discharge Orders          Ordered    ibuprofen (ADVIL) 600 MG tablet  Every 6 hours PRN        06/09/22 1236    dicyclomine (BENTYL) 20 MG tablet  2 times daily        06/09/22 1242              Wilnette Kales, [place] 06/09/22 1236    Wilnette Kales, [place] 06/09/22 1754    Wilnette Kales, PA 06/09/22 Lona Kettle    Fredia Sorrow, MD 06/12/22 [phone]

## 2022-06-22 ENCOUNTER — Encounter: Payer: Self-pay | Admitting: Gastroenterology

## 2022-06-22 NOTE — Progress Notes (Signed)
Referring Provider: Tilda Burrow, NP Primary Care Physician:  Tilda Burrow, NP Primary Gastroenterologist:  Dr. Abbey Chatters  Chief Complaint  Patient presents with   Abdominal Pain    Right side upper dull abdominal pain. Has had it for a month or so.     HPI:   Gerald Rios is a 61 y.o. male presenting today at the request of Ronnald Ramp, Kristi S, NP for abdominal pain.  Patient was seen in the emergency room 06/09/2022 for RUQ abdominal pain.  Reported symptoms have been present over the last 2 to 3 months, intermittent in nature.  Denied postprandial symptoms.  Reported some pain with movement.  Noted improvement with psych treatment.  Had previously discussed symptoms with pulmonology who recommended Citrucel for possible IBS.  With this, he reported his symptoms had significantly improved, but came to the emergency department for continued concern.  On exam he had mild right upper quadrant tenderness.  He had no significant laboratory abnormalities including CBC, CMP, lipase, UA.  RUQ ultrasound was normal.  He had improvement after receiving ibuprofen in the emergency room.  Suspected MSK etiology of pain given the area of tenderness as well as reproducibility with abdominal flexion and horizontal twisting of the abdomen.  Recommended Tylenol/ibuprofen as needed for pain, trial of Bentyl, follow-up with PCP.  CT A/P without contrast 06/22/2022 with tiny exophytic cyst lateral aspect of the right kidney, otherwise no abnormalities.  Today: Dull RUQ abdominal pain. Waxes and waynes. Some days he doesn't notice it. Started a few months ago. No worsening, just persistent. Hasn't identified triggers. When eating, sometimes it gets better. No nausea or vomiting. No heartburn or dysphagia. Not affected by bowel movements. No constipation or diarrhea. Bowels move daily. No brbpr, melena, or weight loss.   Doesn't remember doing anything to cause the pain- ie lifting something heavy, straining  himself, falling.   Went to hospital in April for pneumonia. Noticed it a little while after that. Has COPD. Doesn't cough much. Breathing is much better. Uses O2 at night-2L  When he was an infant, he had an abdominal surgery because he couldn't digest. Not sure what the problem was.   Drinks a few beer a week.  Rare use of ibuprofen. No aspirin powders.  Tried Bentyl, but this didn't make a difference.    No Fhx of colon cancer, stomach cancer.    Prior EGD: Never.  Prior colonoscopy: Never.     Past Medical History:  Diagnosis Date   Acute respiratory failure with hypoxia and hypercarbia (HCC)    COPD (chronic obstructive pulmonary disease) (HCC)    Heart failure (HCC)    HTN (hypertension)    Oxygen deficiency    Pneumonia    Pulmonary hypertension ([place])     Past Surgical History:  Procedure Laterality Date   ABDOMINAL SURGERY     as an infant    Current Outpatient Medications  Medication Sig Dispense Refill   losartan (COZAAR) 25 MG tablet Take 1 tablet (25 mg total) by mouth daily. 30 tablet 1   sildenafil (REVATIO) 20 MG tablet Take 20 mg by mouth 3 (three) times daily.     umeclidinium-vilanterol (ANORO ELLIPTA) 62.5-25 MCG/ACT AEPB Inhale 1 puff into the lungs daily. 1 each 11   ibuprofen (ADVIL) 600 MG tablet Take 1 tablet (600 mg total) by mouth every 6 (six) hours as needed. (Patient not taking: Reported on 06/25/2022) 30 tablet 0   No current facility-administered medications for this visit.  Allergies as of 06/25/2022   (No Known Allergies)    Family History  Problem Relation Age of Onset   Colon cancer Neg Hx     Social History   Socioeconomic History   Marital status: Single    Spouse name: Not on file   Number of children: Not on file   Years of education: Not on file   Highest education level: Not on file  Occupational History   Not on file  Tobacco Use   Smoking status: Former    Types: Cigarettes    Quit date: 08/2016    Years  since quitting: 5.8   Smokeless tobacco: Former  Scientific laboratory technician Use: Never used  Substance and Sexual Activity   Alcohol use: Yes    Comment: few beer a week   Drug use: Not Currently   Sexual activity: Not on file  Other Topics Concern   Not on file  Social History Narrative   Not on file   Social Determinants of Health   Financial Resource Strain: Not on file  Food Insecurity: Not on file  Transportation Needs: Not on file  Physical Activity: Not on file  Stress: Not on file  Social Connections: Not on file  Intimate Partner Violence: Not on file    Review of Systems: Gen: Denies any fever, chills, cold or flulike symptoms, presyncope, syncope.  CV: Denies chest pain, heart palpitations..  Resp: Denies shortness of breath, cough.  GI: See HPI GU : Denies urinary burning, urinary frequency, urinary hesitancy MS: Denies joint pain. Derm: Denies rash. Psych: Denies depression, anxiety. Heme: See HPI  Physical Exam: BP (!) 148/77 (BP Location: Right Arm, Patient Position: Sitting, Cuff Size: Normal)   Pulse 75   Temp 97.9 F (36.6 C) (Temporal)   Ht '5\' 5"'$  (1.651 m)   Wt 168 lb (76.2 kg)   SpO2 93%   BMI 27.96 kg/m  General:   Alert and oriented. Pleasant and cooperative. Well-nourished and well-developed.  Head:  Normocephalic and atraumatic. Eyes:  Without icterus, sclera clear and conjunctiva pink.  Ears:  Normal auditory acuity. Lungs:  Clear to auscultation bilaterally. No wheezes, rales, or rhonchi. No distress.  Heart:  S1, S2 present without murmurs appreciated.  Abdomen:  +BS, soft, and non-distended. Scar in the RUQ region extending to epigastrium. TTP in RUQ starting along scar and just below costal margin. No HSM noted. No guarding or rebound. No masses appreciated.  Rectal:  Deferred  Msk:  Symmetrical without gross deformities. Normal posture. Extremities:  Without edema. Neurologic:  Alert and  oriented x4;  grossly normal  neurologically. Skin:  Intact without significant lesions or rashes. Psych:  Normal mood and affect.    Assessment:  61 year old male with history of COPD, pulmonary hypertension, HTN, presenting today for further evaluation of RUQ abdominal pain.  RUQ abdominal pain: Few month history of dull RUQ abdominal pain with no identified triggers.  Occasionally improved by eating, but not consistently.  No other significant upper or lower GI symptoms.  No alarm symptoms.  Rare NSAID use. Few beer a week. Trial of bentyl not helpful. Prior evaluation in the emergency room on 06/09/2022.  Laboratory evaluation including CBC, CMP, lipase, UA unrevealing.  RUQ ultrasound was normal.  Recent CT A/P without contrast 06/22/2022 with tiny exophytic cyst lateral aspect of the right kidney, otherwise no abnormalities.  Reports having abdominal surgery as an infant due to not being able to digest well.  He has a  scar in his right upper quadrant extending to the epigastric area.  On exam, he has mild TTP in the right upper quadrant starting along scar and also just below costal margin.   Etiology is unclear.  Could have gastritis, duodenitis, PUD, H. pylori.  May also have discomfort related to adhesions. Recommended EGD for further evaluation.  Discussed empirically starting PPI, but patient prefers to hold off on this until procedures are completed.  Colon cancer screening: Overdue for first-ever screening colonoscopy.  No significant lower GI symptoms.  No alarm symptoms.  No family history of colon cancer.   Plan:  Proceed with upper endoscopy + TCS with propofol by Dr. Abbey Chatters in near future. The risks, benefits, and alternatives have been discussed with the patient in detail. The patient states understanding and desires to proceed.  ASA 3 Follow-up after procedures.   Aliene Altes, PA-C [facility] 06/25/2022

## 2022-06-25 ENCOUNTER — Encounter: Payer: Self-pay | Admitting: *Deleted

## 2022-06-25 ENCOUNTER — Encounter: Payer: Self-pay | Admitting: Gastroenterology

## 2022-06-25 ENCOUNTER — Ambulatory Visit (INDEPENDENT_AMBULATORY_CARE_PROVIDER_SITE_OTHER): Payer: BLUE CROSS/BLUE SHIELD | Admitting: Gastroenterology

## 2022-06-25 VITALS — BP 148/77 | HR 75 | Temp 97.9°F | Ht 65.0 in | Wt 168.0 lb

## 2022-06-25 DIAGNOSIS — R1011 Right upper quadrant pain: Secondary | ICD-10-CM | POA: Diagnosis not present

## 2022-06-25 DIAGNOSIS — Z1211 Encounter for screening for malignant neoplasm of colon: Secondary | ICD-10-CM | POA: Diagnosis not present

## 2022-06-25 MED ORDER — PEG 3350-KCL-NA BICARB-NACL 420 G PO SOLR: 4000.0000 mL | Freq: Once | ORAL | 0 refills | Status: AC

## 2022-06-25 NOTE — Patient Instructions (Addendum)
We will arrange for you to have an upper endoscopy and colonoscopy in the near future with Dr. Abbey Chatters at [facility].  We will see you back in the office after your procedures.  Do not hesitate to call if you have any questions or concerns prior to your next visit.  It was very nice to meet you today!  Aliene Altes, PA-C [facility]

## 2022-07-06 ENCOUNTER — Encounter: Payer: Self-pay | Admitting: Internal Medicine

## 2022-07-06 ENCOUNTER — Ambulatory Visit (INDEPENDENT_AMBULATORY_CARE_PROVIDER_SITE_OTHER): Payer: BLUE CROSS/BLUE SHIELD | Admitting: Internal Medicine

## 2022-07-06 DIAGNOSIS — J449 Chronic obstructive pulmonary disease, unspecified: Secondary | ICD-10-CM

## 2022-07-06 DIAGNOSIS — J9611 Chronic respiratory failure with hypoxia: Secondary | ICD-10-CM | POA: Diagnosis not present

## 2022-07-06 DIAGNOSIS — J9612 Chronic respiratory failure with hypercapnia: Secondary | ICD-10-CM | POA: Diagnosis not present

## 2022-07-06 DIAGNOSIS — Z87891 Personal history of nicotine dependence: Secondary | ICD-10-CM | POA: Diagnosis not present

## 2022-07-06 NOTE — Assessment & Plan Note (Signed)
HC03  01/03/22   =  41  - 04/14/2022   Walked on RA  x  3  lap(s) =  approx 450  ft  @ mod pace, stopped due to end of study  with lowest 02 sats 89%  And sob on last lap  - ONO on RA 04/14/2022 >>> desats <89% x 1: 59 sec   - 07/06/2022   Walked on RA  x  3  lap(s) =  approx 450  ft  @ fast pace, stopped due to end of study with lowest 02 sats 94%   D/c all 02  Each maintenance medication was reviewed in detail including emphasizing most importantly the difference between maintenance and prns and under what circumstances the prns are to be triggered using an action plan format where appropriate.  Total time for H and P, chart review, counseling, reviewing hfa/02 device(s) , directly observing portions of ambulatory 02 saturation study/ and generating customized AVS unique to this office visit / same day charting = 22 min

## 2022-07-06 NOTE — Patient Instructions (Signed)
My office will be contacting you by phone for referral to lung cancer screening program  - if you don't hear back from my office within one week please call us back or notify us thru MyChart and we'll address it right away.   We will walk you today to see if you can safely stop your oxygen   Please schedule a follow up visit in  12 months but call sooner if needed

## 2022-07-06 NOTE — Assessment & Plan Note (Signed)
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

## 2022-07-06 NOTE — Assessment & Plan Note (Addendum)
Quit smoking 2017 > eligible for LDSCT until 2032   Discussed in detail all the  indications, usual  risks and alternatives  relative to the benefits with patient who agrees to proceed with w/u as outlined.

## 2022-07-06 NOTE — Progress Notes (Signed)
Gerald Rios, male    DOB: 1961/07/31    MRN: [id number]   Brief patient profile:  89   yowm quit smoking 2017 self  referred to pulmonary clinic in [place]  04/14/2022 for copd GOLD 4 / ? cor pulmonale (not evident by last echo 01/02/22) p 1st seen as consult 01/02/22 with atypical aecopd while on ACEi > d/c'd     History of Present Illness  04/14/2022  Pulmonary/ 1st office eval/ Melvyn Novas / [facility]  Chief Complaint  Patient presents with   Consult    Dx with pulmonary hypertension since 2017. HFU seen by Dr. Melvyn Novas at [facility] 4/20-4/22. Has O2 at home but he uses as needed.   Dyspnea:  MMRC2 = can't walk a nl pace on a flat grade s sob but does fine slow   Cough: none  Sleep: flat bed/ 1 pillows  SABA use: none now 02  2lpm hs / 2lpm at home and dips to 88% with exertion  Also new c/o months of RUQ crampy pain assoc with change in diet to salads, never with meals though and no nausea, always resolves supine Rec Anoro each am :  one click and then take two good vigorous drags and hold 5 secs at least, then rinse mouth  Make sure you check your oxygen saturation  AT  your highest level of activity (not after you stop)   to be sure it stays over 90%  Classic subdiaphragmatic pain pattern suggests ibs:  We will order an overnight pulse oximetry ONO on RA 04/14/2022 >>> desats <89% x 1: 59 sec 04/21/22 so rec 2lpm and repeat   Need hard copy of your pfts and bring it in next time  Please schedule a follow up visit in 3 months but call sooner if needed     07/06/2022  f/u ov/[place] office/Cassie Henkels re: GOLD 3 copd  maint on anoro  Chief Complaint  Patient presents with   Follow-up    Patient is doing much better likes the anoro   Dyspnea:  MMRC1 = can walk nl pace, flat grade, can't hurry or go uphills or steps s sob   Cough: none  Sleeping: flat fine  SABA use: none  02: none  Covid status: vax x twice  Lung cancer screening: advised    No obvious day to day or daytime  variability or assoc excess/ purulent sputum or mucus plugs or hemoptysis or cp or chest tightness, subjective wheeze or overt sinus or hb symptoms.   Sleeping  without nocturnal  or early am exacerbation  of respiratory  c/o's or need for noct saba. Also denies any obvious fluctuation of symptoms with weather or environmental changes or other aggravating or alleviating factors except as outlined above   No unusual exposure hx or h/o childhood pna/ asthma or knowledge of premature birth.  Current Allergies, Complete Past Medical History, Past Surgical History, Family History, and Social History were reviewed in [employer] record.  ROS  The following are not active complaints unless bolded Hoarseness, sore throat, dysphagia, dental problems, itching, sneezing,  nasal congestion or discharge of excess mucus or purulent secretions, ear ache,   fever, chills, sweats, unintended wt loss or wt gain, classically pleuritic or exertional cp,  orthopnea pnd or arm/hand swelling  or leg swelling, presyncope, palpitations, abdominal pain, anorexia, nausea, vomiting, diarrhea  or change in bowel habits or change in bladder habits, change in stools or change in urine, dysuria, hematuria,  rash,  arthralgias, visual complaints, headache, numbness, weakness or ataxia or problems with walking or coordination,  change in mood or  memory.        Current Meds  Medication Sig   ibuprofen (ADVIL) 600 MG tablet Take 1 tablet (600 mg total) by mouth every 6 (six) hours as needed.   losartan (COZAAR) 25 MG tablet Take 1 tablet (25 mg total) by mouth daily.   sildenafil (REVATIO) 20 MG tablet Take 20 mg by mouth 3 (three) times daily.   umeclidinium-vilanterol (ANORO ELLIPTA) 62.5-25 MCG/ACT AEPB Inhale 1 puff into the lungs daily.        Objective:     Wt Readings from Last 3 Encounters:  07/06/22 167 lb 12.8 oz (76.1 kg)  06/25/22 168 lb (76.2 kg)  06/09/22 166 lb (75.3 kg)      Vital  signs reviewed  07/06/2022  - Note at rest 02 sats  97% on RA   General appearance:    amb wm all smilds     HEENT : Oropharynx  clear  Nasal turbinates nl    NECK :  without  apparent JVD/ palpable Nodes/TM    LUNGS: no acc muscle use,  Mild barrel  contour chest wall with bilateral  Distant bs s audible wheeze and  without cough on insp or exp maneuvers  and mild  Hyperresonant  to  percussion bilaterally     CV:  RRR  no s3 or murmur or increase in P2, and no edema   ABD:  soft and nontender with pos end  insp Hoover's  in the supine position.  No bruits or organomegaly appreciated   MS:  Nl gait/ ext warm without deformities Or obvious joint restrictions  calf tenderness, cyanosis or clubbing     SKIN: warm and dry without lesions    NEURO:  alert, approp, nl sensorium with  no motor or cerebellar deficits apparent.                Assessment

## 2022-07-23 ENCOUNTER — Other Ambulatory Visit: Payer: Self-pay

## 2022-07-23 ENCOUNTER — Encounter (HOSPITAL_COMMUNITY)
Admission: RE | Admit: 2022-07-23 | Discharge: 2022-07-23 | Disposition: A | Discharge status: Home / Self Care | Payer: BLUE CROSS/BLUE SHIELD | Source: Ambulatory Visit | Attending: Internal Medicine | Admitting: Internal Medicine

## 2022-07-24 ENCOUNTER — Telehealth: Payer: Self-pay | Admitting: *Deleted

## 2022-07-24 NOTE — Telephone Encounter (Signed)
Pt informed that insurance did approve procedures but they are out of network and it may cost him a little more. Gave number to preservice center for pt to call and talk with them.

## 2022-07-27 ENCOUNTER — Encounter (HOSPITAL_COMMUNITY): Payer: Self-pay

## 2022-07-27 ENCOUNTER — Ambulatory Visit (HOSPITAL_COMMUNITY)
Admission: RE | Admit: 2022-07-27 | Discharge: 2022-07-27 | Disposition: A | Discharge status: Home / Self Care | Payer: BLUE CROSS/BLUE SHIELD | Attending: Internal Medicine | Admitting: Internal Medicine

## 2022-07-27 ENCOUNTER — Ambulatory Visit (HOSPITAL_COMMUNITY): Payer: BLUE CROSS/BLUE SHIELD | Admitting: Anesthesiology

## 2022-07-27 ENCOUNTER — Encounter (HOSPITAL_COMMUNITY)
Admission: RE | Disposition: A | Discharge status: Home / Self Care | Payer: Self-pay | Source: Home / Self Care | Attending: Internal Medicine

## 2022-07-27 ENCOUNTER — Telehealth: Payer: Self-pay

## 2022-07-27 DIAGNOSIS — K2100 Gastro-esophageal reflux disease with esophagitis, without bleeding: Secondary | ICD-10-CM | POA: Diagnosis not present

## 2022-07-27 DIAGNOSIS — K2970 Gastritis, unspecified, without bleeding: Secondary | ICD-10-CM | POA: Diagnosis not present

## 2022-07-27 DIAGNOSIS — Z1212 Encounter for screening for malignant neoplasm of rectum: Secondary | ICD-10-CM | POA: Diagnosis not present

## 2022-07-27 DIAGNOSIS — D125 Benign neoplasm of sigmoid colon: Secondary | ICD-10-CM

## 2022-07-27 DIAGNOSIS — I509 Heart failure, unspecified: Secondary | ICD-10-CM | POA: Diagnosis not present

## 2022-07-27 DIAGNOSIS — Z1211 Encounter for screening for malignant neoplasm of colon: Secondary | ICD-10-CM

## 2022-07-27 DIAGNOSIS — D123 Benign neoplasm of transverse colon: Secondary | ICD-10-CM | POA: Diagnosis not present

## 2022-07-27 DIAGNOSIS — J449 Chronic obstructive pulmonary disease, unspecified: Secondary | ICD-10-CM | POA: Insufficient documentation

## 2022-07-27 DIAGNOSIS — R1011 Right upper quadrant pain: Secondary | ICD-10-CM | POA: Diagnosis not present

## 2022-07-27 DIAGNOSIS — Z87891 Personal history of nicotine dependence: Secondary | ICD-10-CM | POA: Insufficient documentation

## 2022-07-27 DIAGNOSIS — I110 Hypertensive heart disease with heart failure: Secondary | ICD-10-CM | POA: Diagnosis not present

## 2022-07-27 DIAGNOSIS — D128 Benign neoplasm of rectum: Secondary | ICD-10-CM | POA: Diagnosis not present

## 2022-07-27 DIAGNOSIS — K648 Other hemorrhoids: Secondary | ICD-10-CM | POA: Diagnosis not present

## 2022-07-27 HISTORY — PX: POLYPECTOMY: SHX5525

## 2022-07-27 HISTORY — PX: COLONOSCOPY WITH PROPOFOL: SHX5780

## 2022-07-27 HISTORY — PX: ESOPHAGOGASTRODUODENOSCOPY (EGD) WITH PROPOFOL: SHX5813

## 2022-07-27 HISTORY — PX: BIOPSY: SHX5522

## 2022-07-27 SURGERY — COLONOSCOPY WITH PROPOFOL
Anesthesia: General

## 2022-07-27 MED ORDER — EPHEDRINE SULFATE (PRESSORS) 50 MG/ML IJ SOLN
INTRAMUSCULAR | Status: DC | PRN
  Administered 2022-07-27: 10 mg via INTRAVENOUS
  Administered 2022-07-27: 5 mg via INTRAVENOUS

## 2022-07-27 MED ORDER — LIDOCAINE HCL (PF) 2 % IJ SOLN
INTRAMUSCULAR | Status: AC
  Filled 2022-07-27: qty 5

## 2022-07-27 MED ORDER — PROPOFOL 10 MG/ML IV BOLUS
INTRAVENOUS | Status: DC | PRN
  Administered 2022-07-27: 200 mg via INTRAVENOUS

## 2022-07-27 MED ORDER — EPHEDRINE 5 MG/ML INJ
INTRAVENOUS | Status: AC
  Filled 2022-07-27: qty 5

## 2022-07-27 MED ORDER — PROPOFOL 10 MG/ML IV BOLUS
INTRAVENOUS | Status: AC
  Filled 2022-07-27: qty 40

## 2022-07-27 MED ORDER — PROPOFOL 500 MG/50ML IV EMUL
INTRAVENOUS | Status: DC | PRN
  Administered 2022-07-27: 175 ug/kg/min via INTRAVENOUS

## 2022-07-27 MED ORDER — PANTOPRAZOLE SODIUM 40 MG PO TBEC: 40.0000 mg | DELAYED_RELEASE_TABLET | Freq: Every day | ORAL | 11 refills | Status: DC

## 2022-07-27 MED ORDER — LACTATED RINGERS IV SOLN: INTRAVENOUS | Status: DC

## 2022-07-27 MED ORDER — LIDOCAINE HCL (CARDIAC) PF 100 MG/5ML IV SOSY
PREFILLED_SYRINGE | INTRAVENOUS | Status: DC | PRN
  Administered 2022-07-27: 60 mg via INTRAVENOUS

## 2022-07-27 NOTE — Op Note (Signed)
[facility] Patient Name: Gerald Rios Procedure Date: 07/27/2022 9:38 AM MRN: [id number] Date of Birth: August 22, 1961 Attending MD: Elon Alas. Abbey Chatters , [place], [id number] CSN: [id number] Age: 61 Admit Type: Outpatient Procedure:                Colonoscopy Indications:              Screening for colorectal malignant neoplasm Providers:                Elon Alas. Abbey Chatters, DO, Caprice Kluver, [place], Technician Referring MD:              Medicines:                See the Anesthesia note for documentation of the                            administered medications Complications:            No immediate complications. Estimated Blood Loss:     Estimated blood loss was minimal. Procedure:                Pre-Anesthesia Assessment:                           - The anesthesia plan was to use monitored                            anesthesia care (MAC).                           After obtaining informed consent, the colonoscope                            was passed under direct vision. Throughout the                            procedure, the patient's blood pressure, pulse, and                            oxygen saturations were monitored continuously. The                            PCF-HQ190L ([id number]) scope was introduced through                            the anus and advanced to the the cecum, identified                            by appendiceal orifice and ileocecal valve. The                            colonoscopy was performed without difficulty. The                            patient tolerated the procedure well.  The quality                            of the bowel preparation was evaluated using the                            BBPS [facility] Bowel Preparation Scale) with scores                            of: Right Colon = 3, Transverse Colon = 3 and Left                            Colon = 3 (entire mucosa seen well with no residual                             staining, small fragments of stool or opaque                            liquid). The total BBPS score equals 9. Scope In: 9:56:10 AM Scope Out: 10:09:40 AM Scope Withdrawal Time: 0 hours 11 minutes 32 seconds  Total Procedure Duration: 0 hours 13 minutes 30 seconds  Findings:      The perianal and digital rectal examinations were normal.      Non-bleeding internal hemorrhoids were found during endoscopy.      A 5 mm polyp was found in the transverse colon. The polyp was sessile.       The polyp was removed with a cold snare. Resection and retrieval were       complete.      Three sessile polyps were found in the rectum and sigmoid colon. The       polyps were 4 to 6 mm in size. These polyps were removed with a cold       snare. Resection and retrieval were complete.      The exam was otherwise without abnormality. Impression:               - Non-bleeding internal hemorrhoids.                           - One 5 mm polyp in the transverse colon, removed                            with a cold snare. Resected and retrieved.                           - Three 4 to 6 mm polyps in the rectum and in the                            sigmoid colon, removed with a cold snare. Resected                            and retrieved.                           - The examination was otherwise normal. Moderate Sedation:  Per Anesthesia Care Recommendation:           - Patient has a contact number available for                            emergencies. The signs and symptoms of potential                            delayed complications were discussed with the                            patient. Return to normal activities tomorrow.                            Written discharge instructions were provided to the                            patient.                           - Resume previous diet.                           - Continue present medications.                           - Await pathology results.                            - Repeat colonoscopy in 5 years for surveillance.                           - Return to GI clinic in 3 months. Procedure Code(s):        --- Professional ---                           [phone], Colonoscopy, flexible; with removal of                            tumor(s), polyp(s), or other lesion(s) by snare                            technique Diagnosis Code(s):        --- Professional ---                           Z12.11, Encounter for screening for malignant                            neoplasm of colon                           D12.3, Benign neoplasm of transverse colon (hepatic                            flexure or splenic flexure)  D12.8, Benign neoplasm of rectum                           D12.5, Benign neoplasm of sigmoid colon                           K64.8, Other hemorrhoids CPT copyright 2022 American Medical Association. All rights reserved. The codes documented in this report are preliminary and upon coder review may  be revised to meet current compliance requirements. Elon Alas. Abbey Chatters, DO Leonard Abbey Chatters, DO 07/27/2022 10:12:01 AM This report has been signed electronically. Number of Addenda: 0

## 2022-07-27 NOTE — Transfer of Care (Signed)
Immediate Anesthesia Transfer of Care Note  Patient: Gerald Rios  Procedure(s) Performed: COLONOSCOPY WITH PROPOFOL ESOPHAGOGASTRODUODENOSCOPY (EGD) WITH PROPOFOL BIOPSY POLYPECTOMY  Patient Location: Short Stay  Anesthesia Type:General  Level of Consciousness: awake, alert , and oriented  Airway & Oxygen Therapy: Patient Spontanous Breathing  Post-op Assessment: Report given to RN and Post -op Vital signs reviewed and stable  Post vital signs: Reviewed and stable  Last Vitals:  Vitals Value Taken Time  BP 118/68   Temp 98   Pulse 107   Resp 16   SpO2 96     Last Pain:  Vitals:   07/27/22 0943  TempSrc:   PainSc: 0-No pain      Patients Stated Pain Goal: 7 (93/55/21 7471)  Complications: No notable events documented.

## 2022-07-27 NOTE — Anesthesia Postprocedure Evaluation (Signed)
Anesthesia Post Note  Patient: Adil Tugwell  Procedure(s) Performed: COLONOSCOPY WITH PROPOFOL ESOPHAGOGASTRODUODENOSCOPY (EGD) WITH PROPOFOL BIOPSY POLYPECTOMY  Patient location during evaluation: Phase II Anesthesia Type: General Level of consciousness: awake and alert Pain management: pain level controlled Vital Signs Assessment: post-procedure vital signs reviewed and stable Respiratory status: spontaneous breathing, nonlabored ventilation, respiratory function stable and patient connected to nasal cannula oxygen Cardiovascular status: blood pressure returned to baseline and stable Postop Assessment: no apparent nausea or vomiting Anesthetic complications: no   No notable events documented.   Last Vitals:  Vitals:   07/27/22 0913 07/27/22 1013  BP: (!) 166/90 134/76  Pulse: 96 (!) 114  Resp: 16 18  Temp: 36.6 C 36.4 C  SpO2: 100% 98%    Last Pain:  Vitals:   07/27/22 1013  TempSrc: Oral  PainSc: 0-No pain                 Oneal Biglow Clyde Canterbury

## 2022-07-27 NOTE — H&P (Signed)
Primary Care Physician:  Tilda Burrow, NP Primary Gastroenterologist:  Dr. Abbey Chatters  Pre-Procedure History & Physical: HPI:  Gerald Rios is a 61 y.o. male is here for EGD for RUQ abdominal pain and first ever colonoscopy for colon cancer screening purposes.   Past Medical History:  Diagnosis Date   Acute respiratory failure with hypoxia and hypercarbia (HCC)    COPD (chronic obstructive pulmonary disease) (HCC)    Heart failure (HCC)    HTN (hypertension)    Oxygen deficiency    Pneumonia    Pulmonary hypertension ([place])     Past Surgical History:  Procedure Laterality Date   ABDOMINAL SURGERY     as an infant    Prior to Admission medications   Medication Sig Start Date End Date Taking? Authorizing Provider  lisinopril (ZESTRIL) 20 MG tablet Take 20 mg by mouth daily.   Yes [provider]  sildenafil (REVATIO) 20 MG tablet Take 20 mg by mouth 3 (three) times daily. 11/18/21  Yes [provider]  umeclidinium-vilanterol (ANORO ELLIPTA) 62.5-25 MCG/ACT AEPB Inhale 1 puff into the lungs daily. 04/14/22  Yes Tanda Rockers, MD  ibuprofen (ADVIL) 600 MG tablet Take 1 tablet (600 mg total) by mouth every 6 (six) hours as needed. Patient not taking: Reported on 07/20/2022 06/09/22   Dion Saucier A, PA  losartan (COZAAR) 25 MG tablet Take 1 tablet (25 mg total) by mouth daily. Patient not taking: Reported on 07/20/2022 01/04/22   Orson Eva, MD    Allergies as of 06/25/2022   (No Known Allergies)    Family History  Problem Relation Age of Onset   Colon cancer Neg Hx     Social History   Socioeconomic History   Marital status: Single    Spouse name: Not on file   Number of children: Not on file   Years of education: Not on file   Highest education level: Not on file  Occupational History   Not on file  Tobacco Use   Smoking status: Former    Types: Cigarettes    Quit date: 08/2016    Years since quitting: 5.9   Smokeless tobacco: Former  Brewing technologist Use: Never used  Substance and Sexual Activity   Alcohol use: Yes    Comment: few beer a week   Drug use: Not Currently   Sexual activity: Not on file  Other Topics Concern   Not on file  Social History Narrative   Not on file   Social Determinants of Health   Financial Resource Strain: Not on file  Food Insecurity: Not on file  Transportation Needs: Not on file  Physical Activity: Not on file  Stress: Not on file  Social Connections: Not on file  Intimate Partner Violence: Not on file    Review of Systems: See HPI, otherwise negative ROS  Physical Exam: Vital signs in last 24 hours:     General:   Alert,  Well-developed, well-nourished, pleasant and cooperative in NAD Head:  Normocephalic and atraumatic. Eyes:  Sclera clear, no icterus.   Conjunctiva pink. Ears:  Normal auditory acuity. Nose:  No deformity, discharge,  or lesions. Mouth:  No deformity or lesions, dentition normal. Neck:  Supple; no masses or thyromegaly. Lungs:  Clear throughout to auscultation.   No wheezes, crackles, or rhonchi. No acute distress. Heart:  Regular rate and rhythm; no murmurs, clicks, rubs,  or gallops. Abdomen:  Soft, nontender and nondistended. No masses, hepatosplenomegaly or hernias  noted. Normal bowel sounds, without guarding, and without rebound.   Msk:  Symmetrical without gross deformities. Normal posture. Extremities:  Without clubbing or edema. Neurologic:  Alert and  oriented x4;  grossly normal neurologically. Skin:  Intact without significant lesions or rashes. Cervical Nodes:  No significant cervical adenopathy. Psych:  Alert and cooperative. Normal mood and affect.  Impression/Plan: [name] is here for and EGD for RUQ abdominal pain and a colonoscopy to be performed for colon cancer screening purposes.  The risks of the procedure including infection, bleed, or perforation as well as benefits, limitations, alternatives and imponderables have been reviewed  with the patient. Questions have been answered. All parties agreeable.

## 2022-07-27 NOTE — Op Note (Signed)
[facility] Patient Name: Gerald Rios Procedure Date: 07/27/2022 9:40 AM MRN: [id number] Date of Birth: 1961/03/13 Attending MD: Elon Alas. Abbey Chatters , [place], [id number] CSN: [id number] Age: 61 Admit Type: Outpatient Procedure:                Upper GI endoscopy Indications:              Abdominal pain in the right upper quadrant Providers:                Elon Alas. Abbey Chatters, DO, Caprice Kluver, [place], Technician Referring MD:              Medicines:                See the Anesthesia note for documentation of the                            administered medications Complications:            No immediate complications. Estimated Blood Loss:     Estimated blood loss was minimal. Procedure:                Pre-Anesthesia Assessment:                           - The anesthesia plan was to use monitored                            anesthesia care (MAC).                           After obtaining informed consent, the endoscope was                            passed under direct vision. Throughout the                            procedure, the patient's blood pressure, pulse, and                            oxygen saturations were monitored continuously. The                            GIF-H190 ([id number]) scope was introduced through the                            mouth, and advanced to the second part of duodenum.                            The upper GI endoscopy was accomplished without                            difficulty. The patient tolerated the procedure  well. Scope In: 9:47:45 AM Scope Out: 9:52:20 AM Total Procedure Duration: 0 hours 4 minutes 35 seconds  Findings:      LA Grade A (one or more mucosal breaks less than 5 mm, not extending       between tops of 2 mucosal folds) esophagitis with no bleeding was found       at the gastroesophageal junction. Biopsies were taken with a cold       forceps for histology.       Patchy mild inflammation characterized by erythema was found in the       gastric antrum. Biopsies were taken with a cold forceps for Helicobacter       pylori testing.      The duodenal bulb, first portion of the duodenum and second portion of       the duodenum were normal. Impression:               - LA Grade A reflux esophagitis with no bleeding.                            Biopsied.                           - Gastritis. Biopsied.                           - Normal duodenal bulb, first portion of the                            duodenum and second portion of the duodenum. Moderate Sedation:      Per Anesthesia Care Recommendation:           - Patient has a contact number available for                            emergencies. The signs and symptoms of potential                            delayed complications were discussed with the                            patient. Return to normal activities tomorrow.                            Written discharge instructions were provided to the                            patient.                           - Resume previous diet.                           - Continue present medications.                           - Await pathology results.                           -  Use Protonix (pantoprazole) 40 mg PO daily.                           - Return to GI clinic in 3 months. Procedure Code(s):        --- Professional ---                           [phone], Esophagogastroduodenoscopy, flexible,                            transoral; with biopsy, single or multiple Diagnosis Code(s):        --- Professional ---                           K21.00, Gastro-esophageal reflux disease with                            esophagitis, without bleeding                           K29.70, Gastritis, unspecified, without bleeding                           R10.11, Right upper quadrant pain CPT copyright 2022 American Medical Association. All rights reserved. The codes  documented in this report are preliminary and upon coder review may  be revised to meet current compliance requirements. Elon Alas. Abbey Chatters, DO [place] Abbey Chatters, DO 07/27/2022 9:54:37 AM This report has been signed electronically. Number of Addenda: 0

## 2022-07-27 NOTE — Anesthesia Preprocedure Evaluation (Signed)
Anesthesia Evaluation  Patient identified by MRN, date of birth, ID band Patient awake    Reviewed: Allergy & Precautions, H&P , NPO status , Patient's Chart, lab work & pertinent test results, reviewed documented beta blocker date and time   Airway Mallampati: II  TM Distance: >3 FB Neck ROM: full    Dental no notable dental hx.    Pulmonary COPD, former smoker   Pulmonary exam normal breath sounds clear to auscultation       Cardiovascular Exercise Tolerance: Good hypertension, +CHF  (-) Orthopnea and (-) DOE  Rhythm:regular Rate:Normal     Neuro/Psych negative neurological ROS  negative psych ROS   GI/Hepatic negative GI ROS, Neg liver ROS,,,  Endo/Other  negative endocrine ROS    Renal/GU negative Renal ROS  negative genitourinary   Musculoskeletal   Abdominal   Peds  Hematology negative hematology ROS (+)   Anesthesia Other Findings   Reproductive/Obstetrics negative OB ROS                             Anesthesia Physical Anesthesia Plan  ASA: 3  Anesthesia Plan: General   Post-op Pain Management:    Induction:   PONV Risk Score and Plan: TIVA  Airway Management Planned:   Additional Equipment:   Intra-op Plan:   Post-operative Plan:   Informed Consent: I have reviewed the patients History and Physical, chart, labs and discussed the procedure including the risks, benefits and alternatives for the proposed anesthesia with the patient or authorized representative who has indicated his/her understanding and acceptance.     Dental Advisory Given  Plan Discussed with: CRNA  Anesthesia Plan Comments:        Anesthesia Quick Evaluation

## 2022-07-27 NOTE — Discharge Instructions (Addendum)
EGD Discharge instructions Please read the instructions outlined below and refer to this sheet in the next few weeks. These discharge instructions provide you with general information on caring for yourself after you leave the hospital. Your doctor may also give you specific instructions. While your treatment has been planned according to the most current medical practices available, unavoidable complications occasionally occur. If you have any problems or questions after discharge, please call your doctor. ACTIVITY You may resume your regular activity but move at a slower pace for the next 24 hours.  Take frequent rest periods for the next 24 hours.  Walking will help expel (get rid of) the air and reduce the bloated feeling in your abdomen.  No driving for 24 hours (because of the anesthesia (medicine) used during the test).  You may shower.  Do not sign any important legal documents or operate any machinery for 24 hours (because of the anesthesia used during the test).  NUTRITION Drink plenty of fluids.  You may resume your normal diet.  Begin with a light meal and progress to your normal diet.  Avoid alcoholic beverages for 24 hours or as instructed by your caregiver.  MEDICATIONS You may resume your normal medications unless your caregiver tells you otherwise.  WHAT YOU CAN EXPECT TODAY You may experience abdominal discomfort such as a feeling of fullness or "gas" pains.  FOLLOW-UP Your doctor will discuss the results of your test with you.  SEEK IMMEDIATE MEDICAL ATTENTION IF ANY OF THE FOLLOWING OCCUR: Excessive nausea (feeling sick to your stomach) and/or vomiting.  Severe abdominal pain and distention (swelling).  Trouble swallowing.  Temperature over 101 F (37.8 C).  Rectal bleeding or vomiting of blood.     Colonoscopy Discharge Instructions  Read the instructions outlined below and refer to this sheet in the next few weeks. These discharge instructions provide you with  general information on caring for yourself after you leave the hospital. Your doctor may also give you specific instructions. While your treatment has been planned according to the most current medical practices available, unavoidable complications occasionally occur.   ACTIVITY You may resume your regular activity, but move at a slower pace for the next 24 hours.  Take frequent rest periods for the next 24 hours.  Walking will help get rid of the air and reduce the bloated feeling in your belly (abdomen).  No driving for 24 hours (because of the medicine (anesthesia) used during the test).   Do not sign any important legal documents or operate any machinery for 24 hours (because of the anesthesia used during the test).  NUTRITION Drink plenty of fluids.  You may resume your normal diet as instructed by your doctor.  Begin with a light meal and progress to your normal diet. Heavy or fried foods are harder to digest and may make you feel sick to your stomach (nauseated).  Avoid alcoholic beverages for 24 hours or as instructed.  MEDICATIONS You may resume your normal medications unless your doctor tells you otherwise.  WHAT YOU CAN EXPECT TODAY Some feelings of bloating in the abdomen.  Passage of more gas than usual.  Spotting of blood in your stool or on the toilet paper.  IF YOU HAD POLYPS REMOVED DURING THE COLONOSCOPY: No aspirin products for 7 days or as instructed.  No alcohol for 7 days or as instructed.  Eat a soft diet for the next 24 hours.  FINDING OUT THE RESULTS OF YOUR TEST Not all test results are  available during your visit. If your test results are not back during the visit, make an appointment with your caregiver to find out the results. Do not assume everything is normal if you have not heard from your caregiver or the medical facility. It is important for you to follow up on all of your test results.  SEEK IMMEDIATE MEDICAL ATTENTION IF: You have more than a spotting of  blood in your stool.  Your belly is swollen (abdominal distention).  You are nauseated or vomiting.  You have a temperature over 101.  You have abdominal pain or discomfort that is severe or gets worse throughout the day.   Your EGD revealed mild amount inflammation in your stomach.  I took biopsies of this to rule out infection with a bacteria called H. pylori.  Await pathology results, my office will contact you.  Also mild amount of acid reflux in your esophagus which I sampled as well.  Small bowel appeared normal.  I am going to start you on a new medication called pantoprazole 40 mg daily.  This medication works best if you take it at least 30 minutes before breakfast.  Your colonoscopy revealed 4 polyp(s) which I removed successfully. Await pathology results, my office will contact you. I recommend repeating colonoscopy in 5 years for surveillance purposes.   Follow up with GI in 3 months  I hope you have a great rest of your week!  Elon Alas. Abbey Chatters, D.O. Gastroenterology and Hepatology [facility]

## 2022-07-27 NOTE — Telephone Encounter (Signed)
PA came over from [facility] for Pantoprazole Sodium 40 mg DR tablets. There was no OTC PPI listed on former meds list/ ov note. Dx used: K21.9 and R10.11. Waiting for a response from Cover My Meds.

## 2022-07-28 ENCOUNTER — Telehealth: Payer: Self-pay

## 2022-07-28 LAB — SURGICAL PATHOLOGY

## 2022-07-28 NOTE — Telephone Encounter (Signed)
Pt was denied the Pantoprazole. Pt has to try Lansoprazole, Omeprazole, Esomeprazole. Pt's records doesn't show where he has tried anything. Please advise.

## 2022-07-29 ENCOUNTER — Other Ambulatory Visit: Payer: Self-pay | Admitting: Internal Medicine

## 2022-07-29 MED ORDER — OMEPRAZOLE 40 MG PO CPDR: 40.0000 mg | DELAYED_RELEASE_CAPSULE | Freq: Every day | ORAL | 11 refills | Status: AC

## 2022-07-29 NOTE — Telephone Encounter (Signed)
Phoned and advised the pt regarding his Rx for Pantoprazole being denied and he has to try Omeprazole, Lansoprazole, and Esomeprazole first. Pt advised that Omeprazole 40 mg was phoned to his pharmacy. Pt expressed understanding

## 2022-07-29 NOTE — Telephone Encounter (Signed)
Omeprazole 40 mg daily sent to pharmacy

## 2022-08-03 ENCOUNTER — Encounter (HOSPITAL_COMMUNITY): Payer: Self-pay | Admitting: Internal Medicine

## 2022-08-03 NOTE — Telephone Encounter (Signed)
FYI:   Dr Abbey Chatters per insurance the pt has to try OTC Omeprazole, Lansoprazole and or Esomeprazole first. Pt has not tried any of these medications in the past. I spoke with the pt and he expressed understanding and wrote down the names I gave him to try and MG.

## 2022-08-04 NOTE — Telephone Encounter (Signed)
Noted  Pt made aware to try one of one of the OTC acid reflux medication and to take 40 mg. Pt expressed understanding

## 2022-09-24 ENCOUNTER — Encounter: Payer: Self-pay | Admitting: Internal Medicine

## 2023-01-04 ENCOUNTER — Telehealth: Payer: Self-pay | Admitting: Internal Medicine

## 2023-01-04 NOTE — Telephone Encounter (Signed)
Patient states Gerald Rios is too expensive at pharmacy. Would like generic or something else called into pharmacy. Pharmacy is [facility] [place]. Patient phone number is [phone].

## 2023-01-06 ENCOUNTER — Other Ambulatory Visit (HOSPITAL_COMMUNITY): Payer: Self-pay

## 2023-01-06 NOTE — Telephone Encounter (Signed)
Record shows Anoro has a paid claim that was filled on 01-01-2023. Alternatives for LABA+LAMA test claims pull the following results:  Stiolto: $237.32 Bevespi: Requires PA   Patient does have a deductible to meet as well, and this may attribute to high co-pays.

## 2023-01-07 NOTE — Telephone Encounter (Signed)
Called and spoke w/ pt he verbalized understanding. NFN att. 

## 2023-04-08 ENCOUNTER — Other Ambulatory Visit: Payer: Self-pay | Admitting: Internal Medicine

## 2023-07-04 NOTE — Progress Notes (Unsigned)
Gerald Rios, male    DOB: Feb 28, 1961    MRN: [id number]   Brief patient profile:  68   yowm quit smoking 2017 self  referred to pulmonary clinic in [place]  04/14/2022 for copd GOLD 4 / ? cor pulmonale (not evident by last echo 01/02/22) p 1st seen as consult 01/02/22 with atypical aecopd while on ACEi > d/c'd     History of Present Illness  04/14/2022  Pulmonary/ 1st office eval/ Gerald Rios / [facility]  Chief Complaint  Patient presents with   Consult    Dx with pulmonary hypertension since 2017. HFU seen by Dr. Sherene Rios at [facility] 4/20-4/22. Has O2 at home but he uses as needed.   Dyspnea:  MMRC2 = can't walk a nl pace on a flat grade s sob but does fine slow   Cough: none  Sleep: flat bed/ 1 pillows  SABA use: none now 02  2lpm hs / 2lpm at home and dips to 88% with exertion  Also new c/o months of RUQ crampy pain assoc with change in diet to salads, never with meals though and no nausea, always resolves supine Rec Anoro each am :  one click and then take two good vigorous drags and hold 5 secs at least, then rinse mouth  Make sure you check your oxygen saturation  AT  your highest level of activity (not after you stop)   to be sure it stays over 90%  Classic subdiaphragmatic pain pattern suggests ibs:  We will order an overnight pulse oximetry ONO on RA 04/14/2022 >>> desats <89% x 1: 59 sec 04/21/22 so rec 2lpm and repeat    07/06/2022  f/u ov/[name] re: GOLD 4 copd  maint on anoro / still tolerating ACEi fine with min hoarseness.  Chief Complaint  Patient presents with   Follow-up    Patient is doing much better likes the anoro   Dyspnea:  MMRC1 = can walk nl pace, flat grade, can't hurry or go uphills or steps s sob   Cough: none  Sleeping: flat fine  SABA use: none  02: none  Covid status: vax x twice  Lung cancer screening: advised  Rec My office will be contacting you by phone for referral to lung cancer screening program > not done ( pt put off due to cost  concerns)     07/05/2023  f/u ov/[place] office/Gerald Rios re: GOLD 4 copd  maint on Anoro  Chief Complaint  Patient presents with   COPD    Gold 3    Dyspnea:  still MMRC1 = can walk nl pace, flat grade, can't hurry or go uphills or steps s sob  - main problem is carrying equipment up steps  Cough: none  Sleeping: falt bed 1-2 pillows s    resp cc  SABA use: not using  02: none  Lung cancer screening: referred [place]    No obvious day to day or daytime variability or assoc excess/ purulent sputum or mucus plugs or hemoptysis or cp or chest tightness, subjective wheeze or overt sinus or hb symptoms.    Also denies any obvious fluctuation of symptoms with weather or environmental changes or other aggravating or alleviating factors except as outlined above   No unusual exposure hx or h/o childhood pna/ asthma or knowledge of premature birth.  Current Allergies, Complete Past Medical History, Past Surgical History, Family History, and Social History were reviewed in [employer] record.  ROS  The following are not  active complaints unless bolded Hoarseness, sore throat, dysphagia, dental problems, itching, sneezing,  nasal congestion or discharge of excess mucus or purulent secretions, ear ache,   fever, chills, sweats, unintended wt loss or wt gain, classically pleuritic or exertional cp,  orthopnea pnd or arm/hand swelling  or leg swelling, presyncope, palpitations, abdominal pain, anorexia, nausea, vomiting, diarrhea  or change in bowel habits or change in bladder habits, change in stools or change in urine, dysuria, hematuria,  rash, arthralgias, visual complaints, headache, numbness, weakness or ataxia or problems with walking or coordination,  change in mood or  memory.        Current Meds  Medication Sig   lisinopril (ZESTRIL) 20 MG tablet Take 20 mg by mouth daily.   sildenafil (REVATIO) 20 MG tablet Take 20 mg by mouth 3 (three) times daily.    umeclidinium-vilanterol (ANORO ELLIPTA) 62.5-25 MCG/ACT AEPB INHALE 1 PUFF INTO THE LUNGS DAILY             Objective:     07/05/2023      185   07/06/22 167 lb 12.8 oz (76.1 kg)  06/25/22 168 lb (76.2 kg)  06/09/22 166 lb (75.3 kg)      Vital signs reviewed  07/05/2023  - Note at rest 02 sats  92% on RA   General appearance:    slt hoarse mod obese (by BMI)amb wm nad   HEENT : Oropharynx  clear     NECK :  without  apparent JVD/ palpable Nodes/TM   LUNGS: no acc muscle use,  Mild barrel  contour chest wall with bilateral  Distant bs s audible wheeze and  without cough on insp or exp maneuvers  and mild  Hyperresonant  to  percussion bilaterally     CV:  RRR  no s3 or murmur or increase in P2, and no edema   ABD:  soft and nontender with pos end  insp Hoover's  in the supine position.  No bruits or organomegaly appreciated   MS:  Nl gait/ ext warm without deformities Or obvious joint restrictions  calf tenderness, cyanosis or clubbing     SKIN: warm and dry without lesions    NEURO:  alert, approp, nl sensorium with  no motor or cerebellar deficits apparent.            Assessment

## 2023-07-05 ENCOUNTER — Ambulatory Visit: Payer: BLUE CROSS/BLUE SHIELD | Admitting: Internal Medicine

## 2023-07-05 ENCOUNTER — Encounter: Payer: Self-pay | Admitting: Internal Medicine

## 2023-07-05 VITALS — BP 139/76 | HR 82 | Ht 65.0 in | Wt 185.0 lb

## 2023-07-05 DIAGNOSIS — J449 Chronic obstructive pulmonary disease, unspecified: Secondary | ICD-10-CM

## 2023-07-05 DIAGNOSIS — E669 Obesity, unspecified: Secondary | ICD-10-CM | POA: Insufficient documentation

## 2023-07-05 DIAGNOSIS — Z87891 Personal history of nicotine dependence: Secondary | ICD-10-CM

## 2023-07-05 NOTE — Assessment & Plan Note (Addendum)
Quit smoking 2017 > eligible for LDSCT until 2032  - referred again 07/05/2023   Low-dose CT lung cancer screening is recommended for patients who are 13-60 years of age with a 20+ pack-year history of smoking and who are currently smoking or quit <=15 years ago. No coughing up blood  No unintentional weight loss of > 15 pounds in the last 6 months - pt is eligible for scanning yearly until 2032 > referred again   Discussed in detail all the  indications, usual  risks and alternatives  relative to the benefits with patient who agrees to proceed with w/u as outlined.     F/u can be yearly, sooner if needed     Each maintenance medication was reviewed in detail including emphasizing most importantly the difference between maintenance and prns and under what circumstances the prns are to be triggered using an action plan format where appropriate.  Total time for H and P, chart review, counseling, reviewing DPI  device(s) and generating customized AVS unique to this office visit / same day charting = 

## 2023-07-05 NOTE — Assessment & Plan Note (Signed)
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)  - Spirometry  01/12/22 @ [facility]   FEV1 0.54 (17%)  Ratio 0.25 with 13% improvement p saba (80 cc)   - 04/14/2022  After extensive coaching inhaler device,  effectiveness =    80% with dpi > anoro trial x 4 week sample  - ONO on RA done 04/21/22 and spent 1h 59 min < 89% so rec 2lpm and repeat not done, pt d/c'd 02 on his own  Says he feels the best ever on anoro and only doe with heavy exertion so I strongly doubt he is really a GOLD 4 pt but in any case he is clearly Group B in terms of symptom/risk and laba/lama therefore appropriate rx at this point >>>  ok to continue anoro indefinitely.  Needs to work on wt loss though / advised

## 2023-07-05 NOTE — Patient Instructions (Addendum)
My office will be contacting you by phone for referral back to lung cancer screening clinic   - if you don't hear back from my office within one week please call us back or notify us thru MyChart and we'll address it right away.   Please schedule a follow up visit in 12 months but call sooner if needed  with all medications /inhalers/ solutions in hand so we can verify exactly what you are taking. This includes all medications from all doctors and over the counters

## 2023-07-08 IMAGING — CT CT ANGIO CHEST
2 of 7 series · 16 of 36 positions shown · IV contrast (Omnipaque or Isovue)
Comparison: Same day chest x-ray

CLINICAL DATA: Shortness of breath. Pulmonary embolism (PE)
suspected, high prob

EXAM:
CT ANGIOGRAPHY CHEST WITH CONTRAST
TECHNIQUE: Multidetector CT imaging of the chest was performed using the
standard protocol during bolus administration of intravenous
contrast. Multiplanar CT image reconstructions and MIPs were
obtained to evaluate the vascular anatomy.

[Series 5: pe axial thins · axial · 0.76mm/px · z∈[+1172,+1470]mm · 15 of 426 slices shown]
[im 27/426  lung]
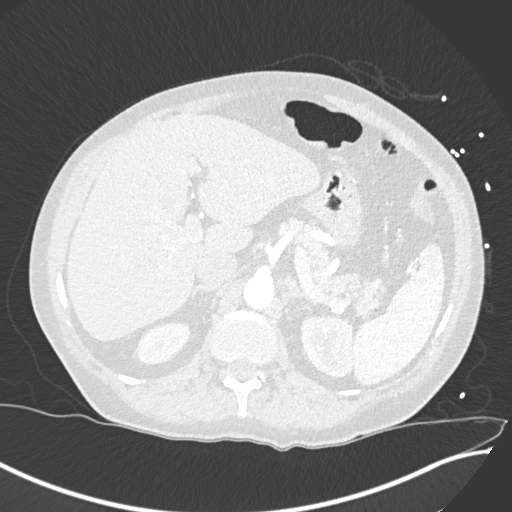
[im 54/426  mediastinal]
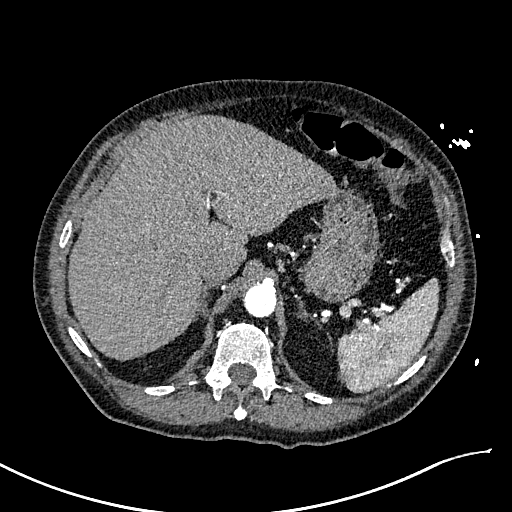
[im 80/426  lung]
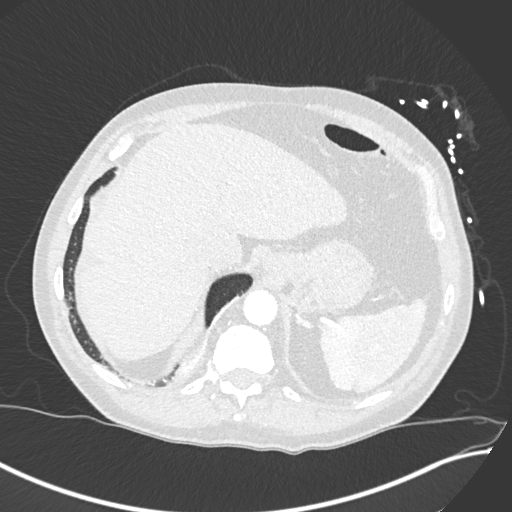
[im 107/426  mediastinal]
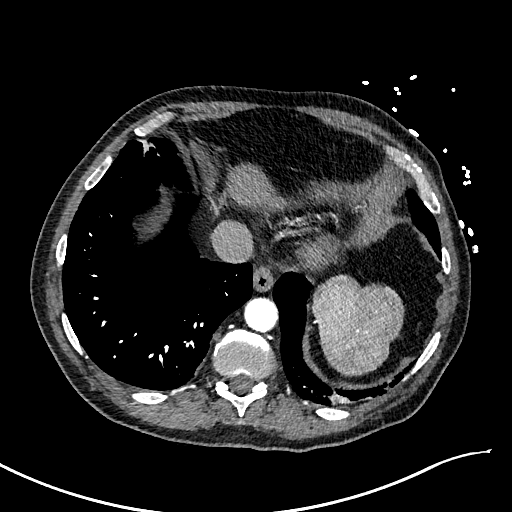
[im 133/426  lung]
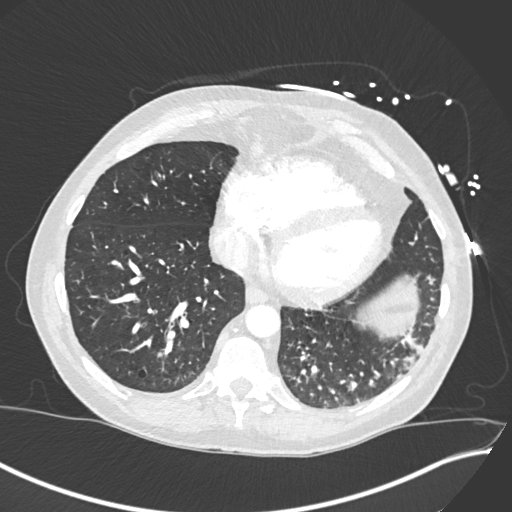
[im 160/426  mediastinal]
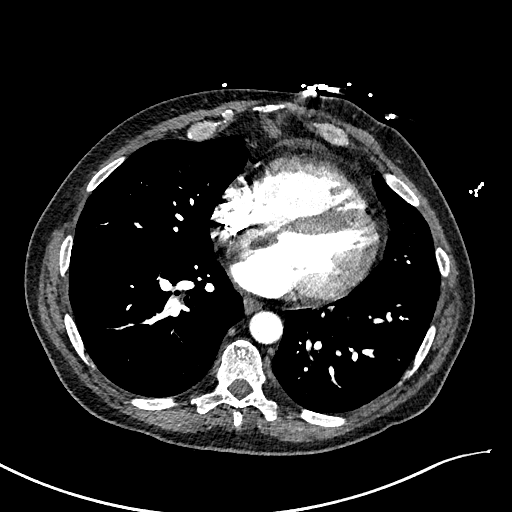
[im 186/426  lung]
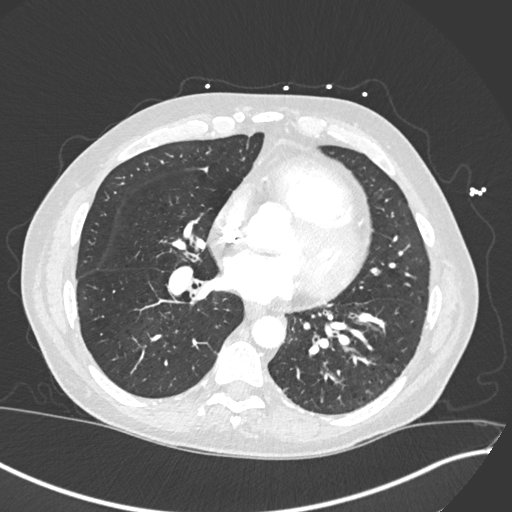
[im 213/426  mediastinal]
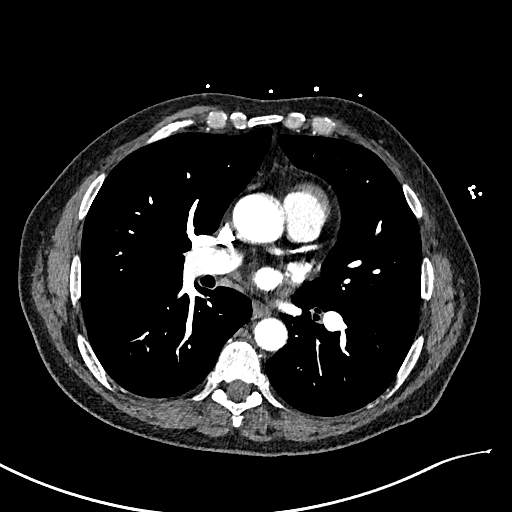
[im 240/426  lung]
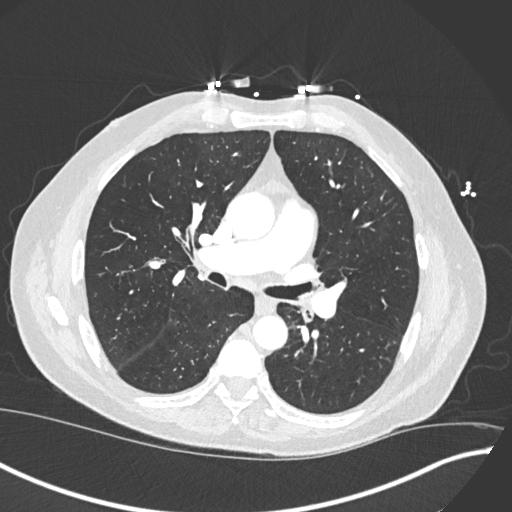
[im 266/426  mediastinal]
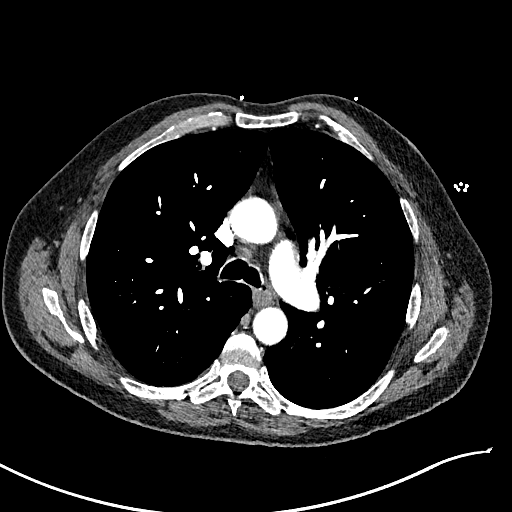
[im 293/426  lung]
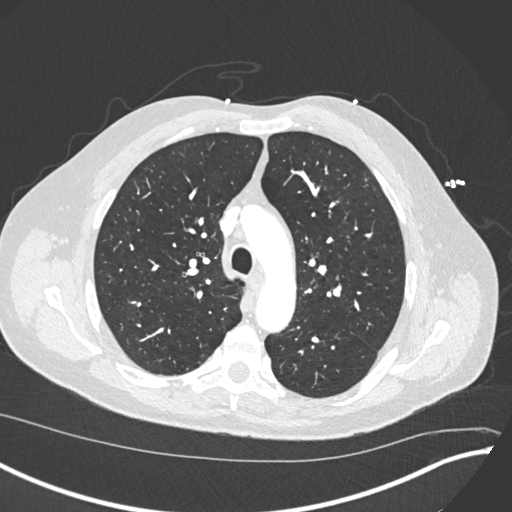
[im 319/426  mediastinal]
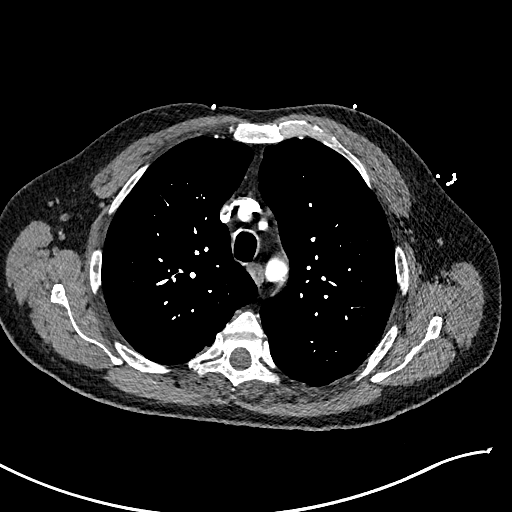
[im 346/426  lung]
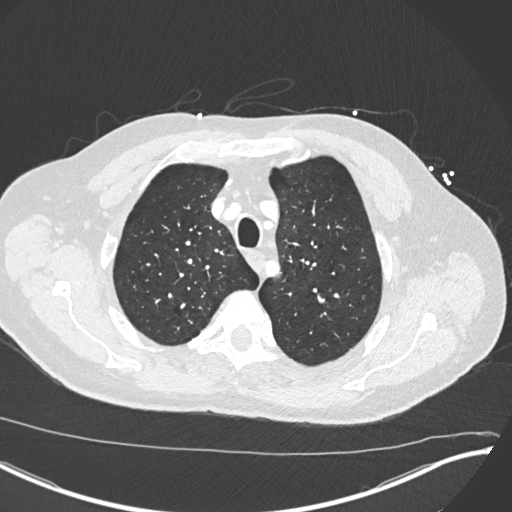
[im 372/426  mediastinal]
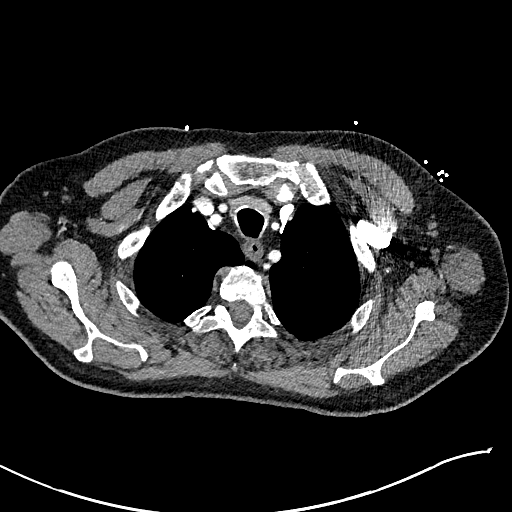
[im 399/426  lung]
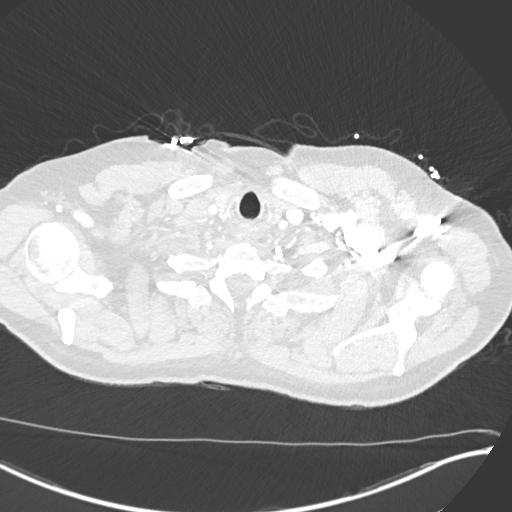

[Series 7: cor soft · coronal · 0.71mm/px · 1 of 154 slices shown]
[im 77/154  mediastinal]
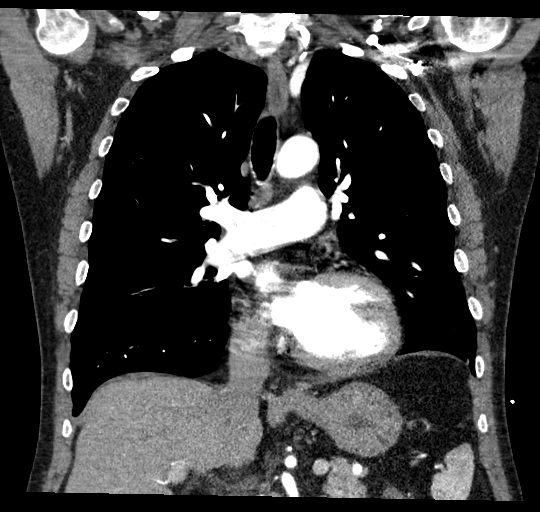

[16 of 36 positions shown; findings below may reference images not displayed]

RADIATION DOSE REDUCTION: This exam was performed according to the
departmental dose-optimization program which includes automated
exposure control, adjustment of the mA and/or kV according to
patient size and/or use of iterative reconstruction technique.

CONTRAST:  75mL OMNIPAQUE IOHEXOL 350 MG/ML SOLN
FINDINGS: Cardiovascular: Satisfactory opacification of the pulmonary arteries
to the segmental level. No evidence of pulmonary embolism. Thoracic
aorta is nonaneurysmal. Minimal atherosclerotic calcification of the
aorta and coronary arteries. Normal heart size. No pericardial
effusion.

Mediastinum/Nodes: No enlarged mediastinal, hilar, or axillary lymph
nodes. Thyroid gland, trachea, and esophagus demonstrate no
significant findings.

Lungs/Pleura: Mild patchy dependent airspace consolidations within
the posterior basal segments of the bilateral lower lobes, left
greater than right. 5 mm noncalcified pulmonary nodule at the
peripheral aspect of the right upper lobe (series 6, image 79).
Minimal centrilobular emphysema with apical predominance. No pleural
effusion or pneumothorax.

Upper Abdomen: No acute abnormality.

Musculoskeletal: No chest wall abnormality. No acute or significant
osseous findings.

Review of the MIP images confirms the above findings.
IMPRESSION: 1. No evidence of pulmonary embolism.
2. Mild patchy dependent airspace consolidations within the
bilateral lower lobes, left greater than right, suggestive of
atelectasis and/or pneumonia.
3. Solitary 5 mm noncalcified right upper lobe pulmonary nodule. No
follow-up needed if patient is low-risk.This recommendation follows
the consensus statement: Guidelines for Management of Incidental
Pulmonary Nodules Detected on CT Images: From the [HOSPITAL]
4. Aortic and coronary artery atherosclerosis.
5. Minimal centrilobular emphysema ([id number]).

## 2023-10-07 ENCOUNTER — Other Ambulatory Visit: Payer: Self-pay | Admitting: Internal Medicine

## 2024-04-17 ENCOUNTER — Other Ambulatory Visit: Payer: Self-pay | Admitting: Internal Medicine

## 2024-06-19 NOTE — Progress Notes (Unsigned)
 Gerald Rios, male    DOB: Jun 09, 1961    MRN: [id number]   Brief patient profile:  62  yowm quit smoking 2017 self  referred to pulmonary clinic in [place]  04/14/2022 for copd GOLD 4 / ? cor pulmonale (not evident by last echo 01/02/22) p 1st seen as consult 01/02/22 with atypical aecopd while on ACEi > d/c'd   History of Present Illness  04/14/2022  Pulmonary/ 1st office eval/ Gerald Rios / [facility]  Chief Complaint  Patient presents with   Consult    Dx with pulmonary hypertension since 2017. HFU seen by Dr. Darlean at [facility] 4/20-4/22. Has O2 at home but he uses as needed.   Dyspnea:  MMRC2 = can't walk a nl pace on a flat grade s sob but does fine slow   Cough: none  Sleep: flat bed/ 1 pillows  SABA use: none now 02  2lpm hs / 2lpm at home and dips to 88% with exertion  Also new c/o months of RUQ crampy pain assoc with change in diet to salads, never with meals though and no nausea, always resolves supine Rec Anoro each am :  one click and then take two good vigorous drags and hold 5 secs at least, then rinse mouth  Make sure you check your oxygen saturation  AT  your highest level of activity (not after you stop)   to be sure it stays over 90%  Classic subdiaphragmatic pain pattern suggests ibs:  We will order an overnight pulse oximetry ONO on RA 04/14/2022 >>> desats <89% x 1: 59 sec 04/21/22 so rec 2lpm and repeat    07/06/2022  f/u ov/[place] office/Gerald Rios re: GOLD 4 copd  maint on anoro / still tolerating ACEi fine with min hoarseness.  Chief Complaint  Patient presents with   Follow-up    Patient is doing much better likes the anoro   Dyspnea:  MMRC1 = can walk nl pace, flat grade, can't hurry or go uphills or steps s sob   Cough: none  Sleeping: flat fine  SABA use: none  02: none  Covid status: vax x twice  Lung cancer screening: advised  Rec My office will be contacting you by phone for referral to lung cancer screening program > not done ( pt put off due to cost  concerns)     07/05/2023  f/u ov/ office/Gerald Rios re: GOLD 4 copd  maint on Anoro  Chief Complaint  Patient presents with   COPD    Gold 3    Dyspnea:  still MMRC1 = can walk nl pace, flat grade, can't hurry or go uphills or steps s sob  - main problem is carrying equipment up steps  Cough: none  Sleeping: falt bed 1-2 pillows s    resp cc  SABA use: not using  02: none Lung cancer screening: referred again Rec   06/20/2024  f/u ov/ office/Gerald Rios re: GOLD 4 copd maint on ***  No chief complaint on file.   Dyspnea:  *** Cough: *** Sleeping: ***   resp cc  SABA use: *** 02: ***  Lung cancer screening: ***   No obvious day to day or daytime variability or assoc excess/ purulent sputum or mucus plugs or hemoptysis or cp or chest tightness, subjective wheeze or overt sinus or hb symptoms.    Also denies any obvious fluctuation of symptoms with weather or environmental changes or other aggravating or alleviating factors except as outlined above   No unusual exposure hx  or h/o childhood pna/ asthma or knowledge of premature birth.  Current Allergies, Complete Past Medical History, Past Surgical History, Family History, and Social History were reviewed in [employer] record.  ROS  The following are not active complaints unless bolded Hoarseness, sore throat, dysphagia, dental problems, itching, sneezing,  nasal congestion or discharge of excess mucus or purulent secretions, ear ache,   fever, chills, sweats, unintended wt loss or wt gain, classically pleuritic or exertional cp,  orthopnea pnd or arm/hand swelling  or leg swelling, presyncope, palpitations, abdominal pain, anorexia, nausea, vomiting, diarrhea  or change in bowel habits or change in bladder habits, change in stools or change in urine, dysuria, hematuria,  rash, arthralgias, visual complaints, headache, numbness, weakness or ataxia or problems with walking or coordination,  change in  mood or  memory.        No outpatient medications have been marked as taking for the 06/20/24 encounter (Appointment) with Gerald Roycroft B, MD.            Objective:    Wts  06/20/2024        ***  07/05/2023      185   07/06/22 167 lb 12.8 oz (76.1 kg)  06/25/22 168 lb (76.2 kg)  06/09/22 166 lb (75.3 kg)    Vital signs reviewed  06/20/2024  - Note at rest 02 sats  ***% on ***   General appearance:    ***      Mild barr ***          Assessment

## 2024-06-20 ENCOUNTER — Ambulatory Visit: Admitting: Internal Medicine

## 2024-06-20 ENCOUNTER — Encounter: Payer: Self-pay | Admitting: Internal Medicine

## 2024-06-20 VITALS — BP 142/89 | HR 83 | Ht 65.0 in | Wt 178.0 lb

## 2024-06-20 DIAGNOSIS — J449 Chronic obstructive pulmonary disease, unspecified: Secondary | ICD-10-CM

## 2024-06-20 DIAGNOSIS — I10 Essential (primary) hypertension: Secondary | ICD-10-CM

## 2024-06-20 DIAGNOSIS — Z87891 Personal history of nicotine dependence: Secondary | ICD-10-CM | POA: Diagnosis not present

## 2024-06-20 MED ORDER — VALSARTAN 160 MG PO TABS: 160.0000 mg | ORAL_TABLET | Freq: Every day | ORAL | 11 refills | Status: AC

## 2024-06-20 NOTE — Assessment & Plan Note (Addendum)
 Add valsartan 160 mg one half to one daily by self monitor 06/20/2024 >>>   Strongly prefer valsartan over lisinopril  as ACE inhibitors are problematic in  pts with airway complaints because  even experienced pulmonologists can't always distinguish ace effects from copd/asthma.  By themselves they don't actually cause a problem, much like oxygen can't by itself start a fire, but they certainly serve as a powerful catalyst or enhancer for any fire  or inflammatory process in the upper airway, be it caused by an ET  tube or more commonly reflux (especially in the obese or pts with known GERD or having PNDS  or other common upper airway complaints.   >>> f/u PCP to fine tune the ARB   Discussed in detail all the  indications, usual  risks and alternatives  relative to the benefits with patient who agrees to proceed with Rx as outlined.             Each maintenance medication was reviewed in detail including emphasizing most importantly the difference between maintenance and prns and under what circumstances the prns are to be triggered using an action plan format where appropriate.  Total time for H and P, chart review, counseling, reviewing dpi/ elipta  device(s) and generating customized AVS unique to this office visit / same day charting = 35 min

## 2024-06-20 NOTE — Assessment & Plan Note (Addendum)
 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

## 2024-06-20 NOTE — Patient Instructions (Addendum)
 Continue Anoro one click daily and rinse and gargle   Start valsartan 160 mg take one half daily   - ok to to increase to whole pill if needed with goal 12/0/80 or thereabouts  My office will be contacting you by phone for referral to lung cancer screening   [phone]) - if you don't hear back from my office within one week,  please call us  back or notify us  thru MyChart and we'll address it right away.    Follow up in 3 months with PFTs same day

## 2024-06-20 NOTE — Assessment & Plan Note (Addendum)
 Quit smoking 2017 > eligible for LDSCT until 2032  - referred again 07/05/2023  and again 06/20/2024   Discussed in detail all the  indications, usual  risks and alternatives  relative to the benefits with patient who agrees to proceed with w/u as outlined.

## 2024-07-14 ENCOUNTER — Other Ambulatory Visit: Payer: Self-pay | Admitting: Internal Medicine

## 2024-09-12 ENCOUNTER — Ambulatory Visit: Admitting: Internal Medicine

## 2024-09-12 ENCOUNTER — Ambulatory Visit (INDEPENDENT_AMBULATORY_CARE_PROVIDER_SITE_OTHER): Admitting: Internal Medicine

## 2024-09-12 ENCOUNTER — Encounter: Payer: Self-pay | Admitting: Internal Medicine

## 2024-09-12 ENCOUNTER — Ambulatory Visit (HOSPITAL_COMMUNITY)
Admission: RE | Admit: 2024-09-12 | Discharge: 2024-09-12 | Disposition: A | Discharge status: Home / Self Care | Source: Ambulatory Visit | Attending: Internal Medicine | Admitting: Internal Medicine

## 2024-09-12 ENCOUNTER — Ambulatory Visit: Payer: Self-pay | Admitting: Internal Medicine

## 2024-09-12 VITALS — BP 118/70 | HR 96 | Ht 65.0 in | Wt 189.0 lb

## 2024-09-12 DIAGNOSIS — J9612 Chronic respiratory failure with hypercapnia: Secondary | ICD-10-CM

## 2024-09-12 DIAGNOSIS — J449 Chronic obstructive pulmonary disease, unspecified: Secondary | ICD-10-CM | POA: Insufficient documentation

## 2024-09-12 DIAGNOSIS — J9611 Chronic respiratory failure with hypoxia: Secondary | ICD-10-CM

## 2024-09-12 DIAGNOSIS — Z87891 Personal history of nicotine dependence: Secondary | ICD-10-CM | POA: Diagnosis not present

## 2024-09-12 DIAGNOSIS — I10 Essential (primary) hypertension: Secondary | ICD-10-CM | POA: Diagnosis present

## 2024-09-12 LAB — PULMONARY FUNCTION TEST
DL/VA % pred: 68 %
DL/VA: 2.93 ml/min/mmHg/L
DLCO unc % pred: 63 %
DLCO unc: 14.8 ml/min/mmHg
FEF 25-75 Post: 0.29 L/s
FEF 25-75 Pre: 0.28 L/s
FEF2575-%Change-Post: 6 %
FEF2575-%Pred-Post: 11 %
FEF2575-%Pred-Pre: 11 %
FEV1-%Change-Post: 4 %
FEV1-%Pred-Post: 26 %
FEV1-%Pred-Pre: 25 %
FEV1-Post: 0.79 L
FEV1-Pre: 0.76 L
FEV1FVC-%Change-Post: -4 %
FEV1FVC-%Pred-Pre: 36 %
FEV6-%Change-Post: 2 %
FEV6-%Pred-Post: 57 %
FEV6-%Pred-Pre: 55 %
FEV6-Post: 2.13 L
FEV6-Pre: 2.08 L
FEV6FVC-%Change-Post: -6 %
FEV6FVC-%Pred-Post: 75 %
FEV6FVC-%Pred-Pre: 80 %
FVC-%Change-Post: 8 %
FVC-%Pred-Post: 75 %
FVC-%Pred-Pre: 69 %
FVC-Post: 2.98 L
FVC-Pre: 2.74 L
Post FEV1/FVC ratio: 27 %
Post FEV6/FVC ratio: 72 %
Pre FEV1/FVC ratio: 28 %
Pre FEV6/FVC Ratio: 77 %
RV % pred: 213 %
RV: 4.25 L
TLC % pred: 126 %
TLC: 7.57 L

## 2024-09-12 MED ORDER — ALBUTEROL SULFATE (2.5 MG/3ML) 0.083% IN NEBU
2.5000 mg | INHALATION_SOLUTION | Freq: Once | RESPIRATORY_TRACT | Status: AC
  Administered 2024-09-12: 2.5 mg via RESPIRATORY_TRACT

## 2024-09-12 NOTE — Patient Instructions (Addendum)
 Call the lung cancer screening program directly @  [phone] to discuss scheduling your low dose CT scan as soon as possible so you stay up to date.   Follow up July 2026  - call sooner  if needed

## 2024-09-12 NOTE — Progress Notes (Unsigned)
 "   Gerald Rios, male    DOB: 12/31/1960    MRN: [id number]   Brief patient profile:  65  yowm quit smoking 2017 self  referred to pulmonary clinic in [place]  04/14/2022 for copd GOLD 4 / ? cor pulmonale (not evident by last echo 01/02/22) p 1st seen as consult 01/02/22 with atypical aecopd while on ACEi > d/c'd   History of Present Illness  04/14/2022  Pulmonary/ 1st office eval/ Darlean / [place] [facility]  Chief Complaint  Patient presents with   Consult    Dx with pulmonary hypertension since 2017. HFU seen by Dr. Darlean at [facility] 4/20-4/22. Has O2 at home but he uses as needed.   Dyspnea:  MMRC2 = can't walk a nl pace on a flat grade s sob but does fine slow   Cough: none  Sleep: flat bed/ 1 pillows  SABA use: none now 02  2lpm hs / 2lpm at home and dips to 88% with exertion  Also new c/o months of RUQ crampy pain assoc with change in diet to salads, never with meals though and no nausea, always resolves supine Rec Anoro each am :  one click and then take two good vigorous drags and hold 5 secs at least, then rinse mouth  Make sure you check your oxygen saturation  AT  your highest level of activity (not after you stop)   to be sure it stays over 90%  Classic subdiaphragmatic pain pattern suggests ibs:  We will order an overnight pulse oximetry ONO on RA 04/14/2022 >>> desats <89% x 1: 59 sec 04/21/22 so rec 2lpm and repeat    07/06/2022  f/u ov/[place] office/Jacobus Colvin re: GOLD 4 copd  maint on anoro / still tolerating ACEi fine with min hoarseness.  Chief Complaint  Patient presents with   Follow-up    Patient is doing much better likes the anoro   Dyspnea:  MMRC1 = can walk nl pace, flat grade, can't hurry or go uphills or steps s sob   Cough: none  Sleeping: flat fine  SABA use: none  02: none  Covid status: vax x twice  Lung cancer screening: advised  Rec My office will be contacting you by phone for referral to lung cancer screening program > not done ( pt put off due to cost  concerns)     07/05/2023  f/u ov/[place] office/[employer] Z[employer]da re: GOLD 4 copd  maint on Anoro   Chief Complaint  Patient presents with   COPD    Gold 3    Dyspnea:  still MMRC1 = can walk nl pace, flat grade, can't hurry or go uphills or steps s sob  - main problem is carrying equipment up steps  Cough: none  Sleeping: falt bed 1-2 pillows s    resp cc  SABA use: not using  02: none Lung cancer screening: referred again Rec  LCS  > not done as rec as of 06/20/2024     06/20/2024  Yearly f/u ov/[place] office/Lorrine Killilea re: GOLD 4 copd maint on anoro  Chief Complaint  Patient presents with   COPD    1 yr f/u   Dyspnea:  casino walks all day nl pace/ 1st to third floor ok  Cough: none  Sleeping: flat bed 1-2 pillows s    resp cc  SABA use: none  02: none  Lung cancer screening: referred  Patient Instructions  Continue Anoro one click daily and rinse and gargle  Start valsartan  160 mg take  one half daily   - ok to to increase to whole pill if needed with goal 12/0/80 or thereabouts My office will be contacting you by phone for referral to lung cancer screening   [phone]) > did not do as of 09/12/2024 > given direct number    - PFT's  09/12/24   FEV1 0.79 (26 % ) ratio 0.27  p 4 % improvement from saba p anoro prior to study with DLCO  14.8 (63%)   and FV curve classically concave with air trapping on lung volume      09/12/2024  f/u ov/[place] office/Calleen Alvis re: COPD GOLD 4   maint on Anoro   Chief Complaint  Patient presents with   COPD    Pft 12/30 Shob   Dyspnea:  only if gets in a hurry  - can do steps slowly x one flight  Cough: none  Sleeping: flat bed 1-2 pillow s  resp cc  SABA use: not using  02: none   Lung cancer screening: pt  given direct phone number    No obvious day to day or daytime variability or assoc excess/ purulent sputum or mucus plugs or hemoptysis or cp or chest tightness, subjective wheeze or overt sinus or hb symptoms.    Also denies any  obvious fluctuation of symptoms with weather or environmental changes or other aggravating or alleviating factors except as outlined above   No unusual exposure hx or h/o childhood pna/ asthma or knowledge of premature birth.  Current Allergies, Complete Past Medical History, Past Surgical History, Family History, and Social History were reviewed in [employer] record.  ROS  The following are not active complaints unless bolded Hoarseness, sore throat, dysphagia, dental problems, itching, sneezing,  nasal congestion or discharge of excess mucus or purulent secretions, ear ache,   fever, chills, sweats, unintended wt loss or wt gain, classically pleuritic or exertional cp,  orthopnea pnd or arm/hand swelling  or leg swelling, presyncope, palpitations, abdominal pain, anorexia, nausea, vomiting, diarrhea  or change in bowel habits or change in bladder habits, change in stools or change in urine, dysuria, hematuria,  rash, arthralgias, visual complaints, headache, numbness, weakness or ataxia or problems with walking or coordination,  change in mood or  memory.         Outpatient Medications Prior to Visit  Medication Sig Dispense Refill   umeclidinium-vilanterol (ANORO ELLIPTA ) 62.5-25 MCG/ACT AEPB INHALE 1 PUFF INTO THE LUNGS DAILY 60 each 4   valsartan  (DIOVAN ) 160 MG tablet Take 1 tablet (160 mg total) by mouth daily. 30 tablet 11   omeprazole  (PRILOSEC) 40 MG capsule Take 1 capsule (40 mg total) by mouth daily. (Patient not taking: Reported on 09/12/2024) 30 capsule 11   sildenafil  (REVATIO ) 20 MG tablet Take 20 mg by mouth 3 (three) times daily. (Patient not taking: Reported on 09/12/2024)     No facility-administered medications prior to visit.            Objective:    Wts  09/12/2024      189  06/20/2024        178  07/05/2023      185   07/06/22 167 lb 12.8 oz (76.1 kg)  06/25/22 168 lb (76.2 kg)  06/09/22 166 lb (75.3 kg)   Vital signs reviewed   09/12/2024  - Note at rest 02 sats  93% on RA  General appearance:    amb wm nad    HEENT : Oropharynx  clear     NECK :  without  apparent JVD/ palpable Nodes/TM    LUNGS: no acc muscle use,  Mild barrel  contour chest wall with bilateral  Distant bs s audible wheeze and  without cough on insp or exp maneuvers  and mild  Hyperresonant  to  percussion bilaterally     CV:  RRR  no s3 or murmur or increase in P2, and no edema   ABD:  soft and nontender   MS:  Nl gait/ ext warm without deformities Or obvious joint restrictions  calf tenderness, cyanosis or clubbing     SKIN: warm and dry without lesions    NEURO:  alert, approp, nl sensorium with  no motor or cerebellar deficits apparent.         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)  - Spirometry  01/12/22 @ [facility]   FEV1 0.54 (17%)  Ratio 0.25 with 13% improvement p saba (80 cc)   - 04/14/2022  After extensive coaching inhaler device,  effectiveness =    80% with dpi > anoro trial x 4 week sample  - PFT's  09/12/24   FEV1 0.79 (26 % ) ratio 0.27  p 4 % improvement from saba p Anoro prior to study with DLCO  14.8 (63%)   and FV curve classically concave with air trapping on lung volume     .Pt is Group B in terms of symptom/risk and laba/lama therefore appropriate rx at this point  >>>> continue Anoro plus dontinue as is with  approp saba prn    Chronic respiratory failure with hypoxia and hypercapnia (HCC) HC03  01/03/22   =  41  - 04/14/2022   Walked on RA  x  3  lap(s) =  approx 450  ft  @ mod pace, stopped due to end of study  with lowest 02 sats 89%  And sob on last lap  - ONO on RA 04/14/2022 >>> desats <89% x 1: 59 sec 04/21/22 so rec 2lpm and repeat  - 07/06/2022   Walked on RA  x  3  lap(s) =  approx 450  ft  @ fast pace, stopped due to end of study with lowest 02 sats 94%   - likely mild  moderately hypercarbic but well compensated clinically (nof/u HC03 levels available  >>>   no additonal recs at this point  Former cigarette smoker Quit smoking 2017 > eligible for LDSCT until 2032  - referred again 07/05/2023  and again 06/20/2024  and again 09/12/2024 (gave him direct number)   Discussed in detail all the  indications, usual  risks and alternatives  relative to the benefits with patient who agrees to proceed with w/u as outlined.          Each maintenance medication was reviewed in detail including emphasizing most importantly the difference between maintenance and prns and under what circumstances the prns are to be triggered using an action plan format where appropriate.  Total time for H and P, chart review, counseling, reviewing DPI/elipta and hfa  device(s) and generating customized AVS unique to this office visit / same day charting = 33 min summary f/u ov        AVS  Patient Instructions  Call the lung cancer screening program directly @  [phone] to discuss scheduling your  low dose CT scan as soon as possible so you stay up to date.   Follow up July 2026  - call sooner  if needed    Ozell America, MD 09/13/2024                              "

## 2024-09-12 NOTE — Progress Notes (Signed)
 Pt has appt in clinic today - results on mychart

## 2024-09-13 NOTE — Assessment & Plan Note (Addendum)
 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

## 2024-09-13 NOTE — Assessment & Plan Note (Addendum)
 Quit smoking 2017 > eligible for LDSCT until 2032  - referred again 07/05/2023  and again 06/20/2024  and again 09/12/2024 (gave him direct number)

## 2024-09-13 NOTE — Assessment & Plan Note (Addendum)
 HC03  01/03/22   =  41  - 04/14/2022   Walked on RA  x  3  lap(s) =  approx 450  ft  @ mod pace, stopped due to end of study  with lowest 02 sats 89%  And sob on last lap  - ONO on RA 04/14/2022 >>> desats <89% x 1: 59 sec 04/21/22 so rec 2lpm and repeat  - 07/06/2022   Walked on RA  x  3  lap(s) =  approx 450  ft  @ fast pace, stopped due to end of study with lowest 02 sats 94%   - likely mild moderately hypercarbic but well compensated clinically (nof/u HC03 levels available  >>>   no additonal recs at this point
